# Patient Record
Sex: Male | Born: 1945 | Race: White | Hispanic: No | Marital: Married | Smoking: Never smoker
Health system: Southern US, Community
[De-identification: ages and names within clinical notes are randomized; demographics above are authoritative.]

## PROBLEM LIST (undated history)

## (undated) DIAGNOSIS — T783XXA Angioneurotic edema, initial encounter: Secondary | ICD-10-CM

## (undated) DIAGNOSIS — L309 Dermatitis, unspecified: Secondary | ICD-10-CM

## (undated) HISTORY — DX: Dermatitis, unspecified: L30.9

## (undated) HISTORY — DX: Angioneurotic edema, initial encounter: T78.3XXA

---

## 2000-07-29 ENCOUNTER — Encounter: Admission: RE | Admit: 2000-07-29 | Discharge: 2000-07-29 | Payer: Self-pay | Admitting: Family Medicine

## 2000-09-09 ENCOUNTER — Encounter: Admission: RE | Admit: 2000-09-09 | Discharge: 2000-09-09 | Payer: Self-pay | Admitting: Family Medicine

## 2005-01-21 HISTORY — PX: PROSTATECTOMY: SHX69

## 2005-02-12 ENCOUNTER — Ambulatory Visit: Admission: RE | Admit: 2005-02-12 | Discharge: 2005-02-25 | Payer: Self-pay | Admitting: Radiation Oncology

## 2005-03-27 ENCOUNTER — Encounter (INDEPENDENT_AMBULATORY_CARE_PROVIDER_SITE_OTHER): Payer: Self-pay | Admitting: Specialist

## 2005-03-27 ENCOUNTER — Inpatient Hospital Stay (HOSPITAL_COMMUNITY): Admission: RE | Admit: 2005-03-27 | Discharge: 2005-03-28 | Payer: Self-pay | Admitting: Urology

## 2009-04-13 ENCOUNTER — Ambulatory Visit (HOSPITAL_BASED_OUTPATIENT_CLINIC_OR_DEPARTMENT_OTHER): Admission: RE | Admit: 2009-04-13 | Discharge: 2009-04-13 | Payer: Self-pay | Admitting: Otolaryngology

## 2009-04-18 ENCOUNTER — Ambulatory Visit: Payer: Self-pay | Admitting: Internal Medicine

## 2010-06-08 NOTE — Op Note (Signed)
Chase Guzman, Chase Guzman                 ACCOUNT NO.:  [id number]   MEDICAL RECORD NO.:  [id number]          PATIENT TYPE:  INP   LOCATION:  1407                         FACILITY:  [facility]   PHYSICIAN:  Valetta Fuller, M.D.  DATE OF BIRTH:  Dec 16, 1945   DATE OF PROCEDURE:  03/27/2005  DATE OF DISCHARGE:  03/28/2005                                 OPERATIVE REPORT   PREOPERATIVE DIAGNOSIS:  Clinical stage T1C adenocarcinoma of the prostate.   POSTOPERATIVE DIAGNOSIS:  Clinical stage T1C adenocarcinoma of the prostate.   PROCEDURE PERFORMED:  Robotic assisted laparoscopic radical prostatectomy  with bilateral nerve-sparing.   SURGEON:  Dr. Isabel Caprice   ASSISTANT:  Dr. Laverle Patter   ANESTHESIA:  General.   COMPLICATIONS:  None.   BLOOD LOSS:  200-300 mL.   INDICATIONS:  Mr. Buenger is a 65 year old male.  He was sent to me through  the courtesy of Dr. Su Grand.  The patient was diagnosed with clinical  stage T1C adenocarcinoma of the prostate.  PSA was just borderline at 3.8.  The patient appeared to have low-volume microscopic Gleason 3 + 3 = 6  cancer.  The patient obviously is at low risk for metastatic or locally  advanced disease.  He is generally a young and healthy man.  He has  undergone extensive counseling by Dr. Brunilda Payor, Dr. Ballard Russell, and myself with  regard to treatment options.  He understands that potentially he could even  be observed with active surveillance.  The patient did request definitive  therapy.  He has had multiple discussions about the pros and cons of a  radiation versus surgical approach and elected to proceed with laparoscopic  prostatectomy.  He appeared to understand the advantages and disadvantages,  the potential complications that can occur.  We did not feel that he needed  lymph node dissection given his numbers but was a candidate for bilateral  nerve spare.  He presents now for the procedure.   TECHNIQUE AND FINDINGS:  The patient was brought to the  operating room.  He  had had a full bowel prep and received perioperative antibiotics.  The  patient had successful induction of general anesthesia and was then placed  in dorsal lithotomy position.  He was carefully padded and secured to the  table.  He was then prepped and draped in the usual manner.  A site was  chosen 18 cm from the pubic symphysis just lateral and left to the umbilicus  for placement of the camera port.  This was done using a standard Hasson  open technique.  Once the fascia was opened, finger dissection was used, and  there were no adhesions.  A 12-mm port was placed, and the abdomen was  insufflated.  A Foley catheter had previously been placed, and the bladder  had been emptied.  Careful inspection of the abdomen did not reveal any  abnormalities.  There was a little adhesion in the midline which was taken  down after placement of all the ports.  The patient had 8-mm robotic ports  placed lateral to the camera port  and also an additional port for the third  arm assessed.  A 12 mm laparoscopic assist port was placed, and a 5-mm port  was also placed for suction.  All these placements were done with direct  visual guidance.  Once all the ports were placed, a surgical cart was  docked.   Hook cautery was utilized to reflect the bladder posteriorly and allowing  entry of the space of Retzius.  Prostatic and __________ fascia was  identified and then incised from the apex down to the base of the prostate.  Underlying levator musculature was swept away, isolating the dorsal vein  complex.  Prostatic ligaments were taken down with electrocautery.  The  dorsal vein complex was isolated and divided using an ETS stapling device  down to the urethra.  With the aid of the catheter and the Foley balloon,  the bladder neck was identified and divided anteriorly with cautery.  The  Foley balloon was then deflated, and the catheter was used to retract the  prostate anteriorly.   The bladder neck was then dissected posteriorly,  identifying the ureteral orifices.  Posterior to the bladder neck, the vas  deferens and seminal vesicles were identified.  These structures were  individually isolated and then retracted anteriorly.  We were then able to  easily establish a posterior plane between the rectum and prostate,  utilizing primarily blunt dissective technique.   Attention was then turned to the endopelvic fascia along the lateral aspect  of the prostate which was taken down in order to provide a nerve sparing  bilaterally.  Once this was done, we were able to identify the pedicles  which were isolated and taken down with hemoclips.  We were able to continue  to monitor the area of the neurovascular bundle which was capped lateral and  posterior.  The urethra was then sharply divided which allowed the prostatic  specimen to be placed up in the abdomen.  The pelvis was copiously irrigated  and with a rectal Foley, injected.  There was no evidence of any rectal  injury.  Hemostasis remained quite good.   Attention was then turned to the reconstruction.  We went ahead and closed  the bladder neck with a couple of figure 8 Vicryl sutures at the 3 and 9  o'clock position.  Again, orifices were identified.  The posterior bladder  neck and posterior urethra were then reapproximated with an interrupted 2-0  Vicryl suture.  The anastomosis was then done in a running manner with a 2-0  Monocryl double-armed anastomotic suture, performing a 360 degree running  anastomosis between the bladder neck and urethra.  A new Foley catheter was  replaced in the bladder without difficulty, and irrigation showed no  leakage.  A 19-French Blake drain was then passed through the left robotic  port and positioned in the pelvis and secured.  The surgical cart was then  undocked, and the Endopouch was used to retrieve the specimen through the camera port which had to be opened slightly.  A  3-0 Vicryl suture was used  to close the 12 mm assist port site.  This was done with the aid of a suture  passer.  The camera port incision was then closed with a running 0 Vicryl  suture.  Other port sites were closed with staples.  The patient appeared to  tolerate the procedure well and was brought to recovery room in stable  condition.  ______________________________  Valetta Fuller, M.D.  Electronically Signed     DSG/MEDQ  D:  03/29/2005  T:  03/30/2005  Job:  [id number]

## 2010-06-08 NOTE — H&P (Signed)
NAMESILVER, Chase Guzman                 ACCOUNT NO.:  [id number]   MEDICAL RECORD NO.:  [id number]          PATIENT TYPE:  INP   LOCATION:  0005                         FACILITY:  [facility]   PHYSICIAN:  Valetta Fuller, M.D.  DATE OF BIRTH:  May 08, 1945   DATE OF ADMISSION:  03/27/2005  DATE OF DISCHARGE:                                HISTORY & PHYSICAL   ADMITTING DIAGNOSIS:  Clinical stage TIc adenocarcinoma of the prostate.   HISTORY OF PRESENT ILLNESS:  Mr. Gitlin is a very pleasant and generally  healthy 65 year old male. He was recently sent to me through the courtesy of  Dr. Georgina Pillion for further discussion of ongoing management of his recently-  diagnosed clinical stage TIc prostate cancer. He was specifically sent for  discussion of robotic-assisted laparoscopic prostatectomy. The patient was  noted to have a PSA a year ago of about 3.2. His PSA increased to 3.8 and  for that reason he had an ultrasound and biopsy. Initial biopsy showed  glandular atypia and he had a repeat biopsy in December 2006 which showed a  microscopic foci of Gleason 3 + 3 = 6 tumor on the right side. Left-sided  biopsies were negative. His PSA has been less than 4. The patient  understands that he has very low-volume early-stage prostate cancer and it  is extremely unlikely for him to have any significant clinical problems for  at least 10 years given this scenario. On the other hand, he obviously has a  long life expectancy given his young age and good health and the patient did  request definitive aggressive management. We had extensive discussion about  the treatment options and he elected to undergo robot-assisted laparoscopic  prostatectomy. We feel he is a candidate for a bilateral nerve sparing. We  feel that he does not require a lymph node dissection given the situation at  hand. He is completely asymptomatic and has no other issues.   PAST HISTORY:  Really is unremarkable. The patient has no medical  problems.  He does not take any regular medications. He does have allergies to CODEINE  and PENICILLIN. The patient has had a vasectomy in the past. The patient is  a nonsmoker. There is no family history of prostate cancer.   REVIEW OF SYSTEMS:  Notable only for some mild erectile dysfunction.   PHYSICAL EXAMINATION:  GENERAL:  The patient is a well-developed, well-  nourished male in no acute distress.  VITAL SIGNS:  He is afebrile with a blood pressure 120/74 and a pulse of 68.  Current weight is approximately 224 pounds.  CHEST:  Clear to auscultation.  ABDOMEN:  Protuberant but soft without hepatosplenomegaly, tenderness,  hernia or any other palpable masses.  EXTERNAL GENITALIA:  Exam reveals a normal meatus, normal penis, normal  scrotum, testes and adnexal structures. Prostate is 1+ in size without any  worrisome features.   DATA:  Hemoglobin is 15.3. Creatinine is 1.2.   ASSESSMENT:  Clinical stage TIc adenocarcinoma prostate. The patient is  otherwise a healthy 65 year old. He is to undergo an attempt at robot-  assisted  laparoscopic radical retropubic prostatectomy and hopefully he will  be admitted for routine postoperative care.           ______________________________  Valetta Fuller, M.D.  Electronically Signed     DSG/MEDQ  D:  03/27/2005  T:  03/27/2005  Job:  [id number]

## 2013-04-14 DIAGNOSIS — J342 Deviated nasal septum: Secondary | ICD-10-CM | POA: Insufficient documentation

## 2013-04-14 DIAGNOSIS — R490 Dysphonia: Secondary | ICD-10-CM | POA: Insufficient documentation

## 2013-04-14 DIAGNOSIS — J309 Allergic rhinitis, unspecified: Secondary | ICD-10-CM | POA: Insufficient documentation

## 2013-09-28 DIAGNOSIS — J386 Stenosis of larynx: Secondary | ICD-10-CM | POA: Insufficient documentation

## 2013-10-28 DIAGNOSIS — Z9989 Dependence on other enabling machines and devices: Secondary | ICD-10-CM

## 2013-10-28 DIAGNOSIS — G4733 Obstructive sleep apnea (adult) (pediatric): Secondary | ICD-10-CM | POA: Insufficient documentation

## 2015-06-28 DIAGNOSIS — Z0000 Encounter for general adult medical examination without abnormal findings: Secondary | ICD-10-CM | POA: Diagnosis not present

## 2015-06-28 DIAGNOSIS — Z1159 Encounter for screening for other viral diseases: Secondary | ICD-10-CM | POA: Diagnosis not present

## 2015-06-28 DIAGNOSIS — D239 Other benign neoplasm of skin, unspecified: Secondary | ICD-10-CM | POA: Diagnosis not present

## 2015-06-28 DIAGNOSIS — L570 Actinic keratosis: Secondary | ICD-10-CM | POA: Diagnosis not present

## 2015-07-18 DIAGNOSIS — J386 Stenosis of larynx: Secondary | ICD-10-CM | POA: Diagnosis not present

## 2015-07-18 DIAGNOSIS — J384 Edema of larynx: Secondary | ICD-10-CM | POA: Diagnosis not present

## 2015-07-18 DIAGNOSIS — R490 Dysphonia: Secondary | ICD-10-CM | POA: Diagnosis not present

## 2015-07-19 DIAGNOSIS — R490 Dysphonia: Secondary | ICD-10-CM | POA: Diagnosis not present

## 2015-07-31 DIAGNOSIS — Z8546 Personal history of malignant neoplasm of prostate: Secondary | ICD-10-CM | POA: Diagnosis not present

## 2016-01-17 DIAGNOSIS — Z880 Allergy status to penicillin: Secondary | ICD-10-CM | POA: Diagnosis not present

## 2016-01-17 DIAGNOSIS — R768 Other specified abnormal immunological findings in serum: Secondary | ICD-10-CM | POA: Diagnosis not present

## 2016-01-17 DIAGNOSIS — J386 Stenosis of larynx: Secondary | ICD-10-CM | POA: Diagnosis not present

## 2016-01-21 DIAGNOSIS — J069 Acute upper respiratory infection, unspecified: Secondary | ICD-10-CM | POA: Diagnosis not present

## 2016-01-25 DIAGNOSIS — E039 Hypothyroidism, unspecified: Secondary | ICD-10-CM | POA: Diagnosis not present

## 2016-01-25 DIAGNOSIS — G4733 Obstructive sleep apnea (adult) (pediatric): Secondary | ICD-10-CM | POA: Diagnosis not present

## 2016-01-25 DIAGNOSIS — E559 Vitamin D deficiency, unspecified: Secondary | ICD-10-CM | POA: Diagnosis not present

## 2016-02-14 DIAGNOSIS — Z713 Dietary counseling and surveillance: Secondary | ICD-10-CM | POA: Diagnosis not present

## 2016-03-20 DIAGNOSIS — G4733 Obstructive sleep apnea (adult) (pediatric): Secondary | ICD-10-CM | POA: Diagnosis not present

## 2016-05-15 DIAGNOSIS — Z0000 Encounter for general adult medical examination without abnormal findings: Secondary | ICD-10-CM | POA: Diagnosis not present

## 2016-05-15 DIAGNOSIS — Z1159 Encounter for screening for other viral diseases: Secondary | ICD-10-CM | POA: Diagnosis not present

## 2016-05-15 DIAGNOSIS — Z8546 Personal history of malignant neoplasm of prostate: Secondary | ICD-10-CM | POA: Diagnosis not present

## 2016-05-15 DIAGNOSIS — Z13220 Encounter for screening for lipoid disorders: Secondary | ICD-10-CM | POA: Diagnosis not present

## 2016-05-15 DIAGNOSIS — E559 Vitamin D deficiency, unspecified: Secondary | ICD-10-CM | POA: Diagnosis not present

## 2016-05-15 DIAGNOSIS — E039 Hypothyroidism, unspecified: Secondary | ICD-10-CM | POA: Diagnosis not present

## 2016-09-04 DIAGNOSIS — E039 Hypothyroidism, unspecified: Secondary | ICD-10-CM | POA: Diagnosis not present

## 2017-04-09 DIAGNOSIS — R0981 Nasal congestion: Secondary | ICD-10-CM | POA: Diagnosis not present

## 2017-07-01 DIAGNOSIS — E039 Hypothyroidism, unspecified: Secondary | ICD-10-CM | POA: Diagnosis not present

## 2017-07-01 DIAGNOSIS — Z8546 Personal history of malignant neoplasm of prostate: Secondary | ICD-10-CM | POA: Diagnosis not present

## 2017-07-01 DIAGNOSIS — R6889 Other general symptoms and signs: Secondary | ICD-10-CM | POA: Diagnosis not present

## 2017-07-01 DIAGNOSIS — Z0000 Encounter for general adult medical examination without abnormal findings: Secondary | ICD-10-CM | POA: Diagnosis not present

## 2017-07-01 DIAGNOSIS — Z13220 Encounter for screening for lipoid disorders: Secondary | ICD-10-CM | POA: Diagnosis not present

## 2017-07-01 DIAGNOSIS — E559 Vitamin D deficiency, unspecified: Secondary | ICD-10-CM | POA: Diagnosis not present

## 2017-07-14 ENCOUNTER — Ambulatory Visit: Payer: Self-pay | Admitting: Allergy and Immunology

## 2017-08-08 ENCOUNTER — Ambulatory Visit (INDEPENDENT_AMBULATORY_CARE_PROVIDER_SITE_OTHER): Payer: Self-pay | Admitting: Allergy

## 2017-08-08 ENCOUNTER — Encounter: Payer: Self-pay | Admitting: Allergy

## 2017-08-08 VITALS — BP 128/80 | HR 63 | Temp 98.1°F | Resp 20 | Ht 67.25 in | Wt 240.8 lb

## 2017-08-08 DIAGNOSIS — Z889 Allergy status to unspecified drugs, medicaments and biological substances status: Secondary | ICD-10-CM

## 2017-08-08 DIAGNOSIS — J310 Chronic rhinitis: Secondary | ICD-10-CM

## 2017-08-08 MED ORDER — FLUTICASONE PROPIONATE 93 MCG/ACT NA EXHU: 2.0000 | INHALANT_SUSPENSION | Freq: Two times a day (BID) | NASAL | 5 refills | Status: DC

## 2017-08-08 NOTE — Patient Instructions (Addendum)
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   - for nasal congestion/drainage recommend use of XHance device 2 sprays each nostril twice a day.  Demonstration shown today  - recommend nasal saline rinse and best to use prior to use of medicated nasal sprays  - recommend taking a long-acting antihistamine like Allegra 180mg , Xyzal 5mg  or Zyrtec 5mg  daily as needed  Drug allergy  - if interested in clearing penicillin allergy would recommend in-office graded challenge to penicillin in future.    Follow-up 6-9 months or sooner if needed

## 2017-08-08 NOTE — Progress Notes (Signed)
New Patient Note  RE: Chase Guzman MRN: [id number] DOB: Jun 04, 1945 Date of Office Visit: 08/08/2017  Referring provider: [name], MD Primary care provider: [name], MD  Chief Complaint: congestion  History of present illness: Chase Guzman is a 72 y.o. male presenting today for consultation for nasal and throat congestion.   He states he has been having issues with congestion that can come on after eating bread, drinking beer and some other food products.   He reports it can also happen without any stimuli.  He always has some degree of nasal congestion and drainage.  He wakes up with congestion.   He does report itchy, watery eyes during pollen season.  He has never tried taking any antihistamines.  He has used flonase in the past for nasal congestion.  He does not feel it helped his congestion.  He states he will occasionally use Afrin which does work very well and states at one point in time he was using too much and had to wean off.     He has never had any allergy testing performed before.    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  He also states he has a unrepaired deviated septum.  He states he is still in the process of diagnosing why he has subglottic stenosis.  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 He states he does have some SOB due to the degree of stenosis but has never required use of inhalers or steroid medications.    He has history of OSA.    He states he does have some mild eczema and only uses moisturization to treat/control.    He states he developed an itchy rash with amoxicillin many years ago (>10 years).    Review of systems: Review of Systems  Constitutional: Negative for chills, fever and malaise/fatigue.  HENT: Positive for congestion. Negative for ear discharge, ear pain, nosebleeds, sinus pain, sore throat and tinnitus.   Eyes: Negative for pain, discharge and redness.    Respiratory: Positive for shortness of breath. Negative for cough, sputum production and wheezing.   Cardiovascular: Negative for chest pain.  Gastrointestinal: Negative for abdominal pain, constipation, diarrhea, heartburn, nausea and vomiting.  Musculoskeletal: Negative for joint pain.  Skin: Negative for itching and rash.  Neurological: Negative for headaches.    All other systems negative unless noted above in HPI  Past medical history: Past Medical History:  Diagnosis Date  . Angio-edema   . Eczema     Past surgical history: Past Surgical History:  Procedure Laterality Date  . PROSTATECTOMY N/A 2007    Family history:  Family History  Problem Relation Age of Onset  . Asthma Father     Social history: Lives in a home with carpeting with electric heating and central cooling.  No pets in the home.  He is a Recruitment consultant at [employer].  Denies smoking history.    Medication List: Allergies as of 08/08/2017      Reactions   Amoxicillin Rash   Penicillins Rash      Medication List        Accurate as of 08/08/17 12:50 PM. Always use your most recent med list.          calcium-vitamin D 250-100 MG-UNIT tablet Take 1 tablet by mouth 2 (two) times daily.   levothyroxine 75 MCG tablet Commonly known as:  SYNTHROID, LEVOTHROID Take 75  mcg by mouth daily before breakfast.       Known medication allergies: Allergies  Allergen Reactions  . Amoxicillin Rash  . Penicillins Rash     Physical examination: Blood pressure 128/80, pulse 63, temperature 98.1 F (36.7 C), temperature source Oral, resp. rate 20, height 5' 7.25" (1.708 m), weight 240 lb 12.8 oz (109.2 kg), SpO2 96 %.  General: Alert, interactive, in no acute distress. HEENT: PERRLA, TMs pearly gray, turbinates moderately edematous L>R with thick discharge, post-pharynx non erythematous. Neck: Supple without lymphadenopathy. Lungs: Clear to auscultation without wheezing, rhonchi or rales. {no  increased work of breathing. CV: Normal S1, S2 without murmurs. Abdomen: Nondistended, nontender. Skin: Warm and dry, without lesions or rashes. Extremities:  No clubbing, cyanosis or edema. Neuro:   Grossly intact.  Diagnositics/Labs: Allergy testing: environmental allergy skin prick testing is positive to [place] plantain, [place]ickory, oak, pecan Intradermal testing is positive to ragweed mix, mold mix 1, mold mix 3, cat, dog, dust mite mix Select food allergy skin prick testing is negative Allergy testing results were read and interpreted by provider, documented by clinical staff.   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)  - environmental allergy skin testing is positive to tree pollens, weed pollen including ragweed, molds, cat, dog and dust mites Allergen avoidance measures discusses/handouts provided  - select food allergy skin prick testing is negative to wheat, oat, rye, barley, hops, chicken,soybean, milk, egg, almond, coconut, rice, yeast, [place], beef, white potato, onion, corn, grape, cinnamon  - there is likely a component of gustatory rhinitis which is a non-allergic form of rhinitis that can lead to increased nasal congestion and drainage after eating certain types of foods (especially hot or spicy foods).   - for nasal congestion/drainage recommend use of XHance device 2 sprays each nostril twice a day.  Demonstration shown today  - recommend nasal saline rinse and best to use prior to use of medicated nasal sprays  - recommend taking a long-acting antihistamine like Allegra 180mg , Xyzal 5mg  or Zyrtec 5mg  daily as needed  Drug allergy  - if interested in clearing penicillin allergy would recommend in-office graded challenge to penicillin in future.    Follow-up 6-9 months or sooner if needed  I appreciate the opportunity to take part in Chase Guzman's care. Please do not hesitate to contact me with questions.  Sincerely,   Prudy Feeler, MD Allergy/Immunology [facility]

## 2017-08-11 DIAGNOSIS — J384 Edema of larynx: Secondary | ICD-10-CM | POA: Diagnosis not present

## 2017-08-11 DIAGNOSIS — R490 Dysphonia: Secondary | ICD-10-CM | POA: Diagnosis not present

## 2017-08-11 DIAGNOSIS — J386 Stenosis of larynx: Secondary | ICD-10-CM | POA: Diagnosis not present

## 2017-09-21 DIAGNOSIS — B349 Viral infection, unspecified: Secondary | ICD-10-CM | POA: Diagnosis not present

## 2017-09-21 DIAGNOSIS — J209 Acute bronchitis, unspecified: Secondary | ICD-10-CM | POA: Diagnosis not present

## 2017-11-26 DIAGNOSIS — Z23 Encounter for immunization: Secondary | ICD-10-CM | POA: Diagnosis not present

## 2018-05-04 ENCOUNTER — Ambulatory Visit: Payer: Self-pay | Admitting: Allergy

## 2018-05-07 ENCOUNTER — Ambulatory Visit: Payer: Self-pay | Admitting: Allergy

## 2018-05-19 ENCOUNTER — Telehealth: Payer: Self-pay

## 2018-05-19 NOTE — Telephone Encounter (Signed)
Contacted patient and stated he was dissatisfied with OV and stated his insurance is [employer] and with [employer]. He would like to be removed from our office.

## 2018-07-16 DIAGNOSIS — Z8546 Personal history of malignant neoplasm of prostate: Secondary | ICD-10-CM | POA: Diagnosis not present

## 2018-07-16 DIAGNOSIS — E7800 Pure hypercholesterolemia, unspecified: Secondary | ICD-10-CM | POA: Diagnosis not present

## 2018-07-16 DIAGNOSIS — E559 Vitamin D deficiency, unspecified: Secondary | ICD-10-CM | POA: Diagnosis not present

## 2018-07-16 DIAGNOSIS — E039 Hypothyroidism, unspecified: Secondary | ICD-10-CM | POA: Diagnosis not present

## 2018-08-07 DIAGNOSIS — Z8249 Family history of ischemic heart disease and other diseases of the circulatory system: Secondary | ICD-10-CM | POA: Diagnosis not present

## 2018-08-07 DIAGNOSIS — E039 Hypothyroidism, unspecified: Secondary | ICD-10-CM | POA: Diagnosis not present

## 2018-08-07 DIAGNOSIS — E7800 Pure hypercholesterolemia, unspecified: Secondary | ICD-10-CM | POA: Diagnosis not present

## 2018-08-07 DIAGNOSIS — Z8546 Personal history of malignant neoplasm of prostate: Secondary | ICD-10-CM | POA: Diagnosis not present

## 2018-08-07 DIAGNOSIS — E559 Vitamin D deficiency, unspecified: Secondary | ICD-10-CM | POA: Diagnosis not present

## 2018-09-23 DIAGNOSIS — G4733 Obstructive sleep apnea (adult) (pediatric): Secondary | ICD-10-CM | POA: Diagnosis not present

## 2018-10-09 DIAGNOSIS — G4733 Obstructive sleep apnea (adult) (pediatric): Secondary | ICD-10-CM | POA: Diagnosis not present

## 2018-10-19 DIAGNOSIS — Z23 Encounter for immunization: Secondary | ICD-10-CM | POA: Diagnosis not present

## 2018-11-08 DIAGNOSIS — G4733 Obstructive sleep apnea (adult) (pediatric): Secondary | ICD-10-CM | POA: Diagnosis not present

## 2018-12-09 DIAGNOSIS — G4733 Obstructive sleep apnea (adult) (pediatric): Secondary | ICD-10-CM | POA: Diagnosis not present

## 2019-01-04 DIAGNOSIS — G4733 Obstructive sleep apnea (adult) (pediatric): Secondary | ICD-10-CM | POA: Diagnosis not present

## 2019-01-08 DIAGNOSIS — G4733 Obstructive sleep apnea (adult) (pediatric): Secondary | ICD-10-CM | POA: Diagnosis not present

## 2019-03-18 DIAGNOSIS — G4733 Obstructive sleep apnea (adult) (pediatric): Secondary | ICD-10-CM | POA: Diagnosis not present

## 2019-03-19 ENCOUNTER — Ambulatory Visit: Payer: Self-pay

## 2019-04-01 DIAGNOSIS — R609 Edema, unspecified: Secondary | ICD-10-CM | POA: Diagnosis not present

## 2019-04-01 DIAGNOSIS — L309 Dermatitis, unspecified: Secondary | ICD-10-CM | POA: Diagnosis not present

## 2019-04-01 DIAGNOSIS — B029 Zoster without complications: Secondary | ICD-10-CM | POA: Diagnosis not present

## 2019-04-19 DIAGNOSIS — E7800 Pure hypercholesterolemia, unspecified: Secondary | ICD-10-CM | POA: Diagnosis not present

## 2019-04-19 DIAGNOSIS — Z8546 Personal history of malignant neoplasm of prostate: Secondary | ICD-10-CM | POA: Diagnosis not present

## 2019-04-19 DIAGNOSIS — E559 Vitamin D deficiency, unspecified: Secondary | ICD-10-CM | POA: Diagnosis not present

## 2019-04-19 DIAGNOSIS — L309 Dermatitis, unspecified: Secondary | ICD-10-CM | POA: Diagnosis not present

## 2019-04-19 DIAGNOSIS — E039 Hypothyroidism, unspecified: Secondary | ICD-10-CM | POA: Diagnosis not present

## 2019-04-19 DIAGNOSIS — D229 Melanocytic nevi, unspecified: Secondary | ICD-10-CM | POA: Diagnosis not present

## 2019-04-19 DIAGNOSIS — Z0000 Encounter for general adult medical examination without abnormal findings: Secondary | ICD-10-CM | POA: Diagnosis not present

## 2019-06-13 ENCOUNTER — Emergency Department (HOSPITAL_COMMUNITY): Payer: BLUE CROSS/BLUE SHIELD

## 2019-06-13 ENCOUNTER — Inpatient Hospital Stay (HOSPITAL_COMMUNITY)
Admission: EM | Admit: 2019-06-13 | Discharge: 2019-06-22 | DRG: 207 | Disposition: A | Discharge status: Home / Self Care | Payer: BLUE CROSS/BLUE SHIELD | Attending: Internal Medicine | Admitting: Internal Medicine

## 2019-06-13 ENCOUNTER — Encounter (HOSPITAL_COMMUNITY): Payer: Self-pay | Admitting: Emergency Medicine

## 2019-06-13 ENCOUNTER — Other Ambulatory Visit: Payer: Self-pay

## 2019-06-13 ENCOUNTER — Inpatient Hospital Stay (HOSPITAL_COMMUNITY): Payer: BLUE CROSS/BLUE SHIELD

## 2019-06-13 DIAGNOSIS — J9383 Other pneumothorax: Secondary | ICD-10-CM

## 2019-06-13 DIAGNOSIS — J386 Stenosis of larynx: Secondary | ICD-10-CM | POA: Diagnosis not present

## 2019-06-13 DIAGNOSIS — E872 Acidosis: Secondary | ICD-10-CM | POA: Diagnosis present

## 2019-06-13 DIAGNOSIS — J9601 Acute respiratory failure with hypoxia: Secondary | ICD-10-CM | POA: Diagnosis present

## 2019-06-13 DIAGNOSIS — R339 Retention of urine, unspecified: Secondary | ICD-10-CM | POA: Diagnosis not present

## 2019-06-13 DIAGNOSIS — E039 Hypothyroidism, unspecified: Secondary | ICD-10-CM | POA: Diagnosis not present

## 2019-06-13 DIAGNOSIS — T17998A Other foreign object in respiratory tract, part unspecified causing other injury, initial encounter: Secondary | ICD-10-CM | POA: Diagnosis not present

## 2019-06-13 DIAGNOSIS — J939 Pneumothorax, unspecified: Secondary | ICD-10-CM | POA: Diagnosis not present

## 2019-06-13 DIAGNOSIS — J342 Deviated nasal septum: Secondary | ICD-10-CM | POA: Diagnosis present

## 2019-06-13 DIAGNOSIS — R6521 Severe sepsis with septic shock: Secondary | ICD-10-CM

## 2019-06-13 DIAGNOSIS — N5089 Other specified disorders of the male genital organs: Secondary | ICD-10-CM | POA: Diagnosis present

## 2019-06-13 DIAGNOSIS — X58XXXA Exposure to other specified factors, initial encounter: Secondary | ICD-10-CM | POA: Diagnosis not present

## 2019-06-13 DIAGNOSIS — J690 Pneumonitis due to inhalation of food and vomit: Secondary | ICD-10-CM | POA: Diagnosis not present

## 2019-06-13 DIAGNOSIS — A419 Sepsis, unspecified organism: Secondary | ICD-10-CM

## 2019-06-13 DIAGNOSIS — I462 Cardiac arrest due to underlying cardiac condition: Secondary | ICD-10-CM | POA: Diagnosis not present

## 2019-06-13 DIAGNOSIS — R34 Anuria and oliguria: Secondary | ICD-10-CM | POA: Diagnosis not present

## 2019-06-13 DIAGNOSIS — I469 Cardiac arrest, cause unspecified: Secondary | ICD-10-CM | POA: Diagnosis not present

## 2019-06-13 DIAGNOSIS — Z880 Allergy status to penicillin: Secondary | ICD-10-CM | POA: Diagnosis not present

## 2019-06-13 DIAGNOSIS — Z01818 Encounter for other preprocedural examination: Secondary | ICD-10-CM

## 2019-06-13 DIAGNOSIS — D649 Anemia, unspecified: Secondary | ICD-10-CM | POA: Diagnosis not present

## 2019-06-13 DIAGNOSIS — Z9119 Patient's noncompliance with other medical treatment and regimen: Secondary | ICD-10-CM | POA: Diagnosis not present

## 2019-06-13 DIAGNOSIS — R092 Respiratory arrest: Secondary | ICD-10-CM

## 2019-06-13 DIAGNOSIS — Z452 Encounter for adjustment and management of vascular access device: Secondary | ICD-10-CM | POA: Diagnosis not present

## 2019-06-13 DIAGNOSIS — J9600 Acute respiratory failure, unspecified whether with hypoxia or hypercapnia: Secondary | ICD-10-CM

## 2019-06-13 DIAGNOSIS — N281 Cyst of kidney, acquired: Secondary | ICD-10-CM | POA: Diagnosis not present

## 2019-06-13 DIAGNOSIS — J80 Acute respiratory distress syndrome: Secondary | ICD-10-CM | POA: Diagnosis not present

## 2019-06-13 DIAGNOSIS — R9431 Abnormal electrocardiogram [ECG] [EKG]: Secondary | ICD-10-CM | POA: Diagnosis not present

## 2019-06-13 DIAGNOSIS — J439 Emphysema, unspecified: Secondary | ICD-10-CM | POA: Diagnosis present

## 2019-06-13 DIAGNOSIS — R404 Transient alteration of awareness: Secondary | ICD-10-CM | POA: Diagnosis not present

## 2019-06-13 DIAGNOSIS — Z20822 Contact with and (suspected) exposure to covid-19: Secondary | ICD-10-CM | POA: Diagnosis not present

## 2019-06-13 DIAGNOSIS — J9811 Atelectasis: Secondary | ICD-10-CM | POA: Diagnosis not present

## 2019-06-13 DIAGNOSIS — Z9079 Acquired absence of other genital organ(s): Secondary | ICD-10-CM

## 2019-06-13 DIAGNOSIS — Z9911 Dependence on respirator [ventilator] status: Secondary | ICD-10-CM | POA: Diagnosis not present

## 2019-06-13 DIAGNOSIS — R918 Other nonspecific abnormal finding of lung field: Secondary | ICD-10-CM | POA: Diagnosis not present

## 2019-06-13 DIAGNOSIS — Z881 Allergy status to other antibiotic agents status: Secondary | ICD-10-CM

## 2019-06-13 DIAGNOSIS — Z4659 Encounter for fitting and adjustment of other gastrointestinal appliance and device: Secondary | ICD-10-CM

## 2019-06-13 DIAGNOSIS — J189 Pneumonia, unspecified organism: Secondary | ICD-10-CM

## 2019-06-13 DIAGNOSIS — Y92239 Unspecified place in hospital as the place of occurrence of the external cause: Secondary | ICD-10-CM | POA: Diagnosis not present

## 2019-06-13 DIAGNOSIS — R739 Hyperglycemia, unspecified: Secondary | ICD-10-CM | POA: Diagnosis not present

## 2019-06-13 DIAGNOSIS — I4510 Unspecified right bundle-branch block: Secondary | ICD-10-CM | POA: Diagnosis not present

## 2019-06-13 DIAGNOSIS — R000 Tachycardia, unspecified: Secondary | ICD-10-CM | POA: Diagnosis not present

## 2019-06-13 DIAGNOSIS — R001 Bradycardia, unspecified: Secondary | ICD-10-CM | POA: Diagnosis not present

## 2019-06-13 DIAGNOSIS — Z4682 Encounter for fitting and adjustment of non-vascular catheter: Secondary | ICD-10-CM | POA: Diagnosis not present

## 2019-06-13 DIAGNOSIS — S270XXD Traumatic pneumothorax, subsequent encounter: Secondary | ICD-10-CM | POA: Diagnosis not present

## 2019-06-13 DIAGNOSIS — Z0189 Encounter for other specified special examinations: Secondary | ICD-10-CM

## 2019-06-13 DIAGNOSIS — R0603 Acute respiratory distress: Secondary | ICD-10-CM | POA: Diagnosis not present

## 2019-06-13 DIAGNOSIS — R110 Nausea: Secondary | ICD-10-CM

## 2019-06-13 DIAGNOSIS — R0602 Shortness of breath: Secondary | ICD-10-CM | POA: Diagnosis not present

## 2019-06-13 DIAGNOSIS — G4733 Obstructive sleep apnea (adult) (pediatric): Secondary | ICD-10-CM | POA: Diagnosis not present

## 2019-06-13 DIAGNOSIS — R4020 Unspecified coma: Secondary | ICD-10-CM | POA: Diagnosis not present

## 2019-06-13 LAB — I-STAT CHEM 8, ED
BUN: 26 mg/dL — ABNORMAL HIGH (ref 8–23)
Calcium, Ion: 1.17 mmol/L (ref 1.15–1.40)
Chloride: 102 mmol/L (ref 98–111)
Creatinine, Ser: 1.4 mg/dL — ABNORMAL HIGH (ref 0.61–1.24)
Glucose, Bld: 218 mg/dL — ABNORMAL HIGH (ref 70–99)
HCT: 50 % (ref 39.0–52.0)
Hemoglobin: 17 g/dL (ref 13.0–17.0)
Potassium: 4.5 mmol/L (ref 3.5–5.1)
Sodium: 138 mmol/L (ref 135–145)
TCO2: 28 mmol/L (ref 22–32)

## 2019-06-13 LAB — POCT I-STAT 7, (LYTES, BLD GAS, ICA,H+H)
Acid-base deficit: 2 mmol/L (ref 0.0–2.0)
Acid-base deficit: 7 mmol/L — ABNORMAL HIGH (ref 0.0–2.0)
Bicarbonate: 25.2 mmol/L (ref 20.0–28.0)
Bicarbonate: 18.7 mmol/L — ABNORMAL LOW (ref 20.0–28.0)
Calcium, Ion: 1.3 mmol/L (ref 1.15–1.40)
Calcium, Ion: 1.2 mmol/L (ref 1.15–1.40)
HCT: 45 % (ref 39.0–52.0)
HCT: 43 % (ref 39.0–52.0)
Hemoglobin: 15.3 g/dL (ref 13.0–17.0)
Hemoglobin: 14.6 g/dL (ref 13.0–17.0)
O2 Saturation: 93 %
O2 Saturation: 95 %
Patient temperature: 97.1
Patient temperature: 97.9
Potassium: 3.6 mmol/L (ref 3.5–5.1)
Potassium: 4.6 mmol/L (ref 3.5–5.1)
Sodium: 138 mmol/L (ref 135–145)
Sodium: 139 mmol/L (ref 135–145)
TCO2: 27 mmol/L (ref 22–32)
TCO2: 20 mmol/L — ABNORMAL LOW (ref 22–32)
pCO2 arterial: 50.8 mmHg — ABNORMAL HIGH (ref 32.0–48.0)
pCO2 arterial: 36.2 mmHg (ref 32.0–48.0)
pH, Arterial: 7.3 — ABNORMAL LOW (ref 7.350–7.450)
pH, Arterial: 7.319 — ABNORMAL LOW (ref 7.350–7.450)
pO2, Arterial: 73 mmHg — ABNORMAL LOW (ref 83.0–108.0)
pO2, Arterial: 79 mmHg — ABNORMAL LOW (ref 83.0–108.0)

## 2019-06-13 LAB — COMPREHENSIVE METABOLIC PANEL
ALT: 24 U/L (ref 0–44)
AST: 29 U/L (ref 15–41)
Albumin: 3.6 g/dL (ref 3.5–5.0)
Alkaline Phosphatase: 79 U/L (ref 38–126)
Anion gap: 12 (ref 5–15)
BUN: 18 mg/dL (ref 8–23)
CO2: 23 mmol/L (ref 22–32)
Calcium: 9.4 mg/dL (ref 8.9–10.3)
Chloride: 102 mmol/L (ref 98–111)
Creatinine, Ser: 1.38 mg/dL — ABNORMAL HIGH (ref 0.61–1.24)
GFR calc Af Amer: 59 mL/min — ABNORMAL LOW (ref >60)
GFR calc non Af Amer: 51 mL/min — ABNORMAL LOW (ref >60)
Glucose, Bld: 262 mg/dL — ABNORMAL HIGH (ref 70–99)
Potassium: 4.5 mmol/L (ref 3.5–5.1)
Sodium: 137 mmol/L (ref 135–145)
Total Bilirubin: 1.1 mg/dL (ref 0.3–1.2)
Total Protein: 6.2 g/dL — ABNORMAL LOW (ref 6.5–8.1)

## 2019-06-13 LAB — LACTIC ACID, PLASMA
Lactic Acid, Venous: 5.6 mmol/L (ref 0.5–1.9)
Lactic Acid, Venous: 5.8 mmol/L (ref 0.5–1.9)
Lactic Acid, Venous: 3.6 mmol/L (ref 0.5–1.9)

## 2019-06-13 LAB — CBC
HCT: 56.1 % — ABNORMAL HIGH (ref 39.0–52.0)
Hemoglobin: 18.3 g/dL — ABNORMAL HIGH (ref 13.0–17.0)
MCH: 31.4 pg (ref 26.0–34.0)
MCHC: 32.6 g/dL (ref 30.0–36.0)
MCV: 96.4 fL (ref 80.0–100.0)
Platelets: 302 10*3/uL (ref 150–400)
RBC: 5.82 MIL/uL — ABNORMAL HIGH (ref 4.22–5.81)
RDW: 12.8 % (ref 11.5–15.5)
WBC: 11.5 10*3/uL — ABNORMAL HIGH (ref 4.0–10.5)
nRBC: 0 % (ref 0.0–0.2)

## 2019-06-13 LAB — GLUCOSE, CAPILLARY
Glucose-Capillary: 85 mg/dL (ref 70–99)
Glucose-Capillary: 82 mg/dL (ref 70–99)
Glucose-Capillary: 85 mg/dL (ref 70–99)
Glucose-Capillary: 97 mg/dL (ref 70–99)

## 2019-06-13 LAB — SARS CORONAVIRUS 2 BY RT PCR (HOSPITAL ORDER, PERFORMED IN ~~LOC~~ HOSPITAL LAB): SARS Coronavirus 2: NEGATIVE

## 2019-06-13 LAB — URINALYSIS, ROUTINE W REFLEX MICROSCOPIC
Bilirubin Urine: NEGATIVE
Glucose, UA: 50 mg/dL — AB
Hgb urine dipstick: NEGATIVE
Ketones, ur: NEGATIVE mg/dL
Leukocytes,Ua: NEGATIVE
Nitrite: NEGATIVE
Protein, ur: >=300 mg/dL — AB
Specific Gravity, Urine: 1.043 — ABNORMAL HIGH (ref 1.005–1.030)
pH: 6 (ref 5.0–8.0)

## 2019-06-13 LAB — TROPONIN I (HIGH SENSITIVITY)
Troponin I (High Sensitivity): 7 ng/L (ref <18)
Troponin I (High Sensitivity): 191 ng/L (ref <18)

## 2019-06-13 LAB — APTT: aPTT: 32 seconds (ref 24–36)

## 2019-06-13 LAB — PROTIME-INR
INR: 1.1 (ref 0.8–1.2)
Prothrombin Time: 13.6 seconds (ref 11.4–15.2)

## 2019-06-13 LAB — STREP PNEUMONIAE URINARY ANTIGEN: Strep Pneumo Urinary Antigen: NEGATIVE

## 2019-06-13 LAB — MRSA PCR SCREENING: MRSA by PCR: NEGATIVE

## 2019-06-13 MED ORDER — MIDAZOLAM HCL 2 MG/2ML IJ SOLN
1.0000 mg | INTRAMUSCULAR | Status: DC | PRN
  Administered 2019-06-13 – 2019-06-18 (×2): 1 mg via INTRAVENOUS
  Filled 2019-06-13 (×4): qty 2

## 2019-06-13 MED ORDER — ETOMIDATE 2 MG/ML IV SOLN
20.0000 mg | Freq: Once | INTRAVENOUS | Status: AC
  Administered 2019-06-13: 20 mg via INTRAVENOUS

## 2019-06-13 MED ORDER — DOCUSATE SODIUM 100 MG PO CAPS: 100.0000 mg | ORAL_CAPSULE | Freq: Two times a day (BID) | ORAL | Status: DC | PRN

## 2019-06-13 MED ORDER — FENTANYL BOLUS VIA INFUSION
25.0000 ug | INTRAVENOUS | Status: DC | PRN
  Administered 2019-06-13 (×3): 25 ug via INTRAVENOUS
  Filled 2019-06-13: qty 25

## 2019-06-13 MED ORDER — IOHEXOL 350 MG/ML SOLN
100.0000 mL | Freq: Once | INTRAVENOUS | Status: AC | PRN
  Administered 2019-06-13: 100 mL via INTRAVENOUS

## 2019-06-13 MED ORDER — CHLORHEXIDINE GLUCONATE CLOTH 2 % EX PADS
6.0000 | MEDICATED_PAD | Freq: Every day | CUTANEOUS | Status: DC
  Administered 2019-06-13 – 2019-06-22 (×9): 6 via TOPICAL

## 2019-06-13 MED ORDER — PROPOFOL 1000 MG/100ML IV EMUL
INTRAVENOUS | Status: AC
  Administered 2019-06-13: 10 ug/kg/min via INTRAVENOUS
  Filled 2019-06-13: qty 100

## 2019-06-13 MED ORDER — SODIUM CHLORIDE 0.9 % IV BOLUS
30.0000 mL/kg | Freq: Once | INTRAVENOUS | Status: AC
  Administered 2019-06-13: 2859 mL via INTRAVENOUS

## 2019-06-13 MED ORDER — ORAL CARE MOUTH RINSE
15.0000 mL | OROMUCOSAL | Status: DC
  Administered 2019-06-13 – 2019-06-19 (×54): 15 mL via OROMUCOSAL

## 2019-06-13 MED ORDER — POLYETHYLENE GLYCOL 3350 17 G PO PACK: 17.0000 g | PACK | Freq: Every day | ORAL | Status: DC | PRN

## 2019-06-13 MED ORDER — VANCOMYCIN HCL 2000 MG/400ML IV SOLN
2000.0000 mg | Freq: Once | INTRAVENOUS | Status: AC
  Administered 2019-06-13: 2000 mg via INTRAVENOUS
  Filled 2019-06-13: qty 400

## 2019-06-13 MED ORDER — MIDAZOLAM HCL 2 MG/2ML IJ SOLN
1.0000 mg | INTRAMUSCULAR | Status: DC | PRN
  Administered 2019-06-13 – 2019-06-18 (×7): 1 mg via INTRAVENOUS
  Filled 2019-06-13 (×5): qty 2

## 2019-06-13 MED ORDER — DOCUSATE SODIUM 50 MG/5ML PO LIQD
100.0000 mg | Freq: Two times a day (BID) | ORAL | Status: DC
  Administered 2019-06-13: 100 mg via ORAL
  Filled 2019-06-13 (×2): qty 10

## 2019-06-13 MED ORDER — DOCUSATE SODIUM 50 MG/5ML PO LIQD
100.0000 mg | Freq: Two times a day (BID) | ORAL | Status: DC
  Administered 2019-06-13 – 2019-06-16 (×5): 100 mg
  Filled 2019-06-13 (×6): qty 10

## 2019-06-13 MED ORDER — FAMOTIDINE IN NACL 20-0.9 MG/50ML-% IV SOLN
20.0000 mg | Freq: Two times a day (BID) | INTRAVENOUS | Status: DC
  Administered 2019-06-13 (×2): 20 mg via INTRAVENOUS
  Filled 2019-06-13 (×2): qty 50

## 2019-06-13 MED ORDER — FENTANYL 2500MCG IN NS 250ML (10MCG/ML) PREMIX INFUSION
0.0000 ug/h | INTRAVENOUS | Status: DC
  Administered 2019-06-13: 25 ug/h via INTRAVENOUS
  Filled 2019-06-13: qty 250

## 2019-06-13 MED ORDER — POLYETHYLENE GLYCOL 3350 17 G PO PACK
17.0000 g | PACK | Freq: Every day | ORAL | Status: DC
  Administered 2019-06-13: 17 g via ORAL
  Filled 2019-06-13: qty 1

## 2019-06-13 MED ORDER — PHENYLEPHRINE HCL-NACL 10-0.9 MG/250ML-% IV SOLN: 25.0000 ug/min | INTRAVENOUS | Status: DC

## 2019-06-13 MED ORDER — SODIUM CHLORIDE 0.9 % IV BOLUS
500.0000 mL | Freq: Once | INTRAVENOUS | Status: AC
  Administered 2019-06-13: 500 mL via INTRAVENOUS

## 2019-06-13 MED ORDER — CHLORHEXIDINE GLUCONATE 0.12% ORAL RINSE (MEDLINE KIT)
15.0000 mL | Freq: Two times a day (BID) | OROMUCOSAL | Status: DC
  Administered 2019-06-13 – 2019-06-19 (×13): 15 mL via OROMUCOSAL

## 2019-06-13 MED ORDER — METRONIDAZOLE IN NACL 5-0.79 MG/ML-% IV SOLN
500.0000 mg | Freq: Three times a day (TID) | INTRAVENOUS | Status: DC
  Administered 2019-06-13 – 2019-06-14 (×3): 500 mg via INTRAVENOUS
  Filled 2019-06-13 (×4): qty 100

## 2019-06-13 MED ORDER — SODIUM CHLORIDE 0.9 % IV SOLN
2.0000 g | Freq: Once | INTRAVENOUS | Status: AC
  Administered 2019-06-13: 2 g via INTRAVENOUS
  Filled 2019-06-13: qty 2

## 2019-06-13 MED ORDER — ENOXAPARIN SODIUM 40 MG/0.4ML ~~LOC~~ SOLN
40.0000 mg | Freq: Every day | SUBCUTANEOUS | Status: DC
  Administered 2019-06-13 – 2019-06-20 (×8): 40 mg via SUBCUTANEOUS
  Filled 2019-06-13 (×8): qty 0.4

## 2019-06-13 MED ORDER — SODIUM CHLORIDE 0.9 % IV SOLN
250.0000 mL | INTRAVENOUS | Status: DC
  Administered 2019-06-13: 250 mL via INTRAVENOUS

## 2019-06-13 MED ORDER — METRONIDAZOLE IN NACL 5-0.79 MG/ML-% IV SOLN
500.0000 mg | Freq: Once | INTRAVENOUS | Status: AC
  Administered 2019-06-13: 500 mg via INTRAVENOUS
  Filled 2019-06-13: qty 100

## 2019-06-13 MED ORDER — NOREPINEPHRINE 4 MG/250ML-% IV SOLN
0.0000 ug/min | INTRAVENOUS | Status: DC
  Administered 2019-06-13: 15 ug/min via INTRAVENOUS
  Administered 2019-06-13: 8 ug/min via INTRAVENOUS
  Administered 2019-06-13: 10 ug/min via INTRAVENOUS
  Filled 2019-06-13: qty 250

## 2019-06-13 MED ORDER — VANCOMYCIN HCL IN DEXTROSE 1-5 GM/200ML-% IV SOLN: 1000.0000 mg | Freq: Once | INTRAVENOUS | Status: DC

## 2019-06-13 MED ORDER — POLYETHYLENE GLYCOL 3350 17 G PO PACK
17.0000 g | PACK | Freq: Every day | ORAL | Status: DC
  Administered 2019-06-14 – 2019-06-16 (×3): 17 g
  Filled 2019-06-13 (×5): qty 1

## 2019-06-13 MED ORDER — NOREPINEPHRINE 4 MG/250ML-% IV SOLN
2.0000 ug/min | INTRAVENOUS | Status: DC
  Administered 2019-06-14: 6 ug/min via INTRAVENOUS
  Filled 2019-06-13: qty 250

## 2019-06-13 MED ORDER — ROCURONIUM BROMIDE 50 MG/5ML IV SOLN
100.0000 mg | Freq: Once | INTRAVENOUS | Status: AC
  Administered 2019-06-13: 100 mg via INTRAVENOUS

## 2019-06-13 MED ORDER — SODIUM CHLORIDE 0.9 % IV SOLN
1.0000 g | INTRAVENOUS | Status: AC
  Administered 2019-06-13 – 2019-06-19 (×7): 1 g via INTRAVENOUS
  Filled 2019-06-13: qty 1
  Filled 2019-06-13 (×2): qty 10
  Filled 2019-06-13: qty 1
  Filled 2019-06-13: qty 10
  Filled 2019-06-13: qty 1
  Filled 2019-06-13: qty 10

## 2019-06-13 MED ORDER — FENTANYL CITRATE (PF) 100 MCG/2ML IJ SOLN
25.0000 ug | Freq: Once | INTRAMUSCULAR | Status: AC
  Administered 2019-06-13: 25 ug via INTRAVENOUS

## 2019-06-13 MED ORDER — PROPOFOL 1000 MG/100ML IV EMUL: 5.0000 ug/kg/min | INTRAVENOUS | Status: DC

## 2019-06-13 MED ORDER — SODIUM CHLORIDE 0.9 % IV SOLN: 2.0000 g | Freq: Once | INTRAVENOUS | Status: DC

## 2019-06-13 MED ORDER — SODIUM CHLORIDE 0.9% FLUSH
3.0000 mL | Freq: Once | INTRAVENOUS | Status: AC
  Administered 2019-06-16: 3 mL via INTRAVENOUS

## 2019-06-13 MED ORDER — FENTANYL 2500MCG IN NS 250ML (10MCG/ML) PREMIX INFUSION
25.0000 ug/h | INTRAVENOUS | Status: DC
  Administered 2019-06-13: 185 ug/h via INTRAVENOUS
  Administered 2019-06-13: 25 ug/h via INTRAVENOUS
  Filled 2019-06-13: qty 250

## 2019-06-13 MED ORDER — NOREPINEPHRINE 4 MG/250ML-% IV SOLN
INTRAVENOUS | Status: AC
  Filled 2019-06-13: qty 250

## 2019-06-13 NOTE — ED Notes (Signed)
Pt wife to bedside. Says the patient has been uncomfortable wearing his cpap at night all this week and has been taking it off periodically at night.

## 2019-06-13 NOTE — ED Triage Notes (Signed)
Patient arrives via [facility]. Per medic report, the patient woke his wife up this morning c/o shortness of breath. Fire initiated BVM on their arrival. On arrival by EMS, the pt was responsive to painful stimuli. En route to facility, the pt mental status decompensated and he is non responsive on arrival to treatment room. HR 120's, 190/116, IV established in the left hand. Pt has had moderna vaccine x 2.

## 2019-06-13 NOTE — H&P (Signed)
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  Most of the history was taken from the wife and staff.  Patient was sedated with fentanyl but easily arousable and answers questions by nodding his head.  Patient was hypotensive with blood pressure of 58/47.  As per wife patient has been noncompliant with his CPAP over the last week or so and has been very frustrated with it due to stress is work.  He has not complained of any sputum, hemoptysis or fever.  As per wife patient was running his errands yesterday with no issues.  She states that he was told that he might need some more dilation of his subglottic stenosis..  Patient was a difficult intubation as per report.  Patient did get his 2 shots of vaccine.  Patient is also Covid negative today.  CT chest is showing bilateral pulmonary infiltrates.   Past Medical History  -Hypothyroidism -Obstructive sleep apnea -Subglottic stenosis -Deviated septum   Objective   Blood pressure (!) 80/66, pulse 80, temperature 98 F (36.7 C), resp. rate 18, weight 95.3 kg, SpO2 100 %.    Vent Mode: PRVC FiO2 (%):  [100 %] 100 % Set Rate:  [18 bmp] 18 bmp Vt Set:  RW:212346 mL] 620 mL PEEP:  [5 cmH20] 5 cmH20 Plateau Pressure:  [18 cmH20] 18 cmH20   Intake/Output Summary (Last 24 hours) at 06/13/2019 0700 Last data filed  at 06/13/2019 [id number] Gross per 24 hour  Intake 393.96 ml  Output --  Net 393.96 ml   Filed Weights   06/13/19 0552  Weight: 95.3 kg    Examination: General: Acutely ill on mechanical ventilation not in distress on 100% x 2 HENT: Orally intubated Lungs: Coarse crackles bilaterally right worse than left Cardiovascular: Normal heart sound no added sounds or murmurs Abdomen: Soft no tenderness no guarding Extremities: Warm with positive pulses Neuro: Sedated but easily arousable moving all extremities answering questions by nodding his head   Assessment & Plan:   Assessment: -Acute hypoxic respiratory failure require mechanical ventilation -Aspiration pneumonia/bilateral multifocal pneumonia -Bilateral infiltrates negative pressure pulmonary edema is a possibility but less likely -History of subglottic stenosis -Septic shock   Plan: -Adjust mechanical ventilation use lung protective strategy keep pulse ox above 92% -Sedate with fentanyl patient did drop his blood pressure with propofol -As needed Versed -Start Levophed and titrate to keep map above 65 -Central line insertion -Bolused with 1 L normal saline -Sputum culture -Ceftriaxone and Flagyl for aspiration pneumonia -Lovenox for DVT prophylaxis -N.p.o. for now -GI prophylaxis -Discussed goals of care patient is full code for now they will discuss with the rest of the family and get back to [place].  Labs   CBC: Recent Labs  Lab 06/13/19 0439 06/13/19 0501 06/13/19 0555  WBC 11.5*  --   --   HGB 18.3*  17.0 15.3  HCT 56.1* 50.0 45.0  MCV 96.4  --   --   PLT 302  --   --     Basic Metabolic Panel: Recent Labs  Lab 06/13/19 0440 06/13/19 0501 06/13/19 0555  NA 137 138 138  K 4.5 4.5 3.6  CL 102 102  --   CO2 23  --   --   GLUCOSE 262* 218*  --   BUN 18 26*  --   CREATININE 1.38* 1.40*  --   CALCIUM 9.4  --   --    GFR: CrCl cannot be calculated (Unknown ideal weight.). Recent Labs  Lab 06/13/19 0439   WBC 11.5*  LATICACIDVEN 5.6*    Liver Function Tests: Recent Labs  Lab 06/13/19 0440  AST 29  ALT 24  ALKPHOS 79  BILITOT 1.1  PROT 6.2*  ALBUMIN 3.6   No results for input(s): LIPASE, AMYLASE in the last 168 hours. No results for input(s): AMMONIA in the last 168 hours.  ABG    Component Value Date/Time   PHART 7.300 (L) 06/13/2019 0555   PCO2ART 50.8 (H) 06/13/2019 0555   PO2ART 73 (L) 06/13/2019 0555   HCO3 25.2 06/13/2019 0555   TCO2 27 06/13/2019 0555   ACIDBASEDEF 2.0 06/13/2019 0555   O2SAT 93.0 06/13/2019 0555     Coagulation Profile: Recent Labs  Lab 06/13/19 0440  INR 1.1    Cardiac Enzymes: No results for input(s): CKTOTAL, CKMB, CKMBINDEX, TROPONINI in the last 168 hours.  HbA1C: No results found for: HGBA1C  CBG: No results for input(s): GLUCAP in the last 168 hours.  Review of Systems:   Could not be obtained due to patient sedation  Past Medical History  He,  has a past medical history of Angio-edema and Eczema.   Surgical History    Past Surgical History:  Procedure Laterality Date  . PROSTATECTOMY N/A 2007     Social History   reports that he has never smoked. He has never used smokeless tobacco. He reports current alcohol use. He reports that he does not use drugs.   Family History   His family history includes Asthma in his father.   Allergies Allergies  Allergen Reactions  . Amoxicillin Rash  . Penicillins Rash     Home Medications  Prior to Admission medications   Medication Sig Start Date End Date Taking? Authorizing Provider  calcium-vitamin D 250-100 MG-UNIT tablet Take 1 tablet by mouth 2 (two) times daily.    [provider]  Fluticasone Propionate (XHANCE) 93 MCG/ACT EXHU Place 2 sprays into the nose 2 (two) times daily. 08/08/17   Kennith Gain, MD  levothyroxine (SYNTHROID, LEVOTHROID) 75 MCG tablet Take 75 mcg by mouth daily before breakfast.    [provider]     Critical  care time: I have spent 55 minutes of critical care time this time was spent at bedside or in the unit this time was exclusive of any billable procedures patient is needing intensive care due to complex medical problems requiring complex medical decisions.

## 2019-06-13 NOTE — Progress Notes (Signed)
eLink Physician-Brief Progress Note Patient Name: Chase Guzman DOB: [birth year] MRN: [id number]   Date of Service  06/13/2019  HPI/Events of Note  Request for KUB for feeding tube confirmation  eICU Interventions  Order placed     Intervention Category Intermediate Interventions: Other:  Margaretmary Lombard 06/13/2019, 9:59 PM

## 2019-06-13 NOTE — Progress Notes (Signed)
Pt transported from ED via ventilator with no complications noted. Wife and son at bedside during transport.

## 2019-06-13 NOTE — ED Provider Notes (Addendum)
Procedure Name: Intubation Date/Time: 06/13/2019 5:00 AM Performed by: Abigail Butts, PA-C Pre-anesthesia Checklist: Patient identified, Emergency Drugs available, Suction available, Patient being monitored and Timeout performed Oxygen Delivery Method: Ambu bag Preoxygenation: Pre-oxygenation with 100% oxygen Induction Type: Rapid sequence Ventilation: Mask ventilation with difficulty Laryngoscope Size: Mac and 4 Tube size: 7.5 mm Number of attempts: 1 Airway Equipment and Method: Video-laryngoscopy and Patient positioned with wedge pillow Placement Confirmation: ETT inserted through vocal cords under direct vision,  CO2 detector and Breath sounds checked- equal and bilateral Secured at: 23 cm Tube secured with: ETT holder Dental Injury: Teeth and Oropharynx as per pre-operative assessment  Difficulty Due To: Difficulty was anticipated Comments: 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 Recommend intubation by anesthesia for future events.          Tomer Chalmers, Gwenlyn Perking 06/13/19 0505    Palumbo, April, MD 06/13/19 8676

## 2019-06-13 NOTE — ED Notes (Signed)
Propofol was initiated for sedation, as patient was hypertensive, tachy, and restless. Pt became hypotensive, propofol was stopped, EDP notified. 500 NS bolus initiated. Sedation switched to Fentanyl. Pt is able to shake his head and squeeze hands to questions. Continues to be hypotensive. CCM at bedside during event, order for additional 500cc bolus, initiated levophed and 25 mcg fentanyl bolus.

## 2019-06-13 NOTE — Progress Notes (Signed)
Notified provider and bedside nurse of need to order repeat lactic acid.  

## 2019-06-13 NOTE — ED Provider Notes (Signed)
[facility] EMERGENCY DEPARTMENT Provider Note   CSN: [id number] Arrival date & time: 06/13/19  0430     History Chief Complaint  Patient presents with  . Shortness of Breath    Chase Guzman is a 74 y.o. male.  The history is provided by the EMS personnel. The history is limited by the condition of the patient.  Shortness of Breath Severity:  Severe Onset quality:  Sudden Timing:  Constant Progression:  Worsening Chronicity:  New Context: not URI   Relieved by:  Nothing Worsened by:  Nothing Ineffective treatments:  None tried Associated symptoms: no fever   Associated symptoms comment:  Went unresponsive  Risk factors: no recent alcohol use        Past Medical History:  Diagnosis Date  . Angio-edema   . Eczema     Patient Active Problem List   Diagnosis Date Noted  . OSA on CPAP 10/28/2013  . 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 Allergic rhinitis 04/14/2013  . Deviated nasal septum 04/14/2013  . Dysphonia 04/14/2013    Past Surgical History:  Procedure Laterality Date  . PROSTATECTOMY N/A 2007       Family History  Problem Relation Age of Onset  . Asthma Father     Social History   Tobacco Use  . Smoking status: Never Smoker  . Smokeless tobacco: Never Used  Substance Use Topics  . Alcohol use: Yes    Comment: occasional   . Drug use: Never    Home Medications Prior to Admission medications   Medication Sig Start Date End Date Taking? Authorizing Provider  calcium-vitamin D 250-100 MG-UNIT tablet Take 1 tablet by mouth 2 (two) times daily.    [provider]  Fluticasone Propionate (XHANCE) 93 MCG/ACT EXHU Place 2 sprays into the nose 2 (two) times daily. 08/08/17   Kennith Gain, MD  levothyroxine (SYNTHROID, LEVOTHROID) 75 MCG tablet Take 75 mcg by mouth daily before breakfast.    [provider]    Allergies    Amoxicillin and Penicillins  Review of Systems   Review of Systems    Constitutional: Negative for fever.  Respiratory: Positive for shortness of breath.     Physical Exam Updated Vital Signs BP 97/83   Pulse 100   Temp (!) 95.1 F (35.1 C)   Resp 18   SpO2 96%   Physical Exam Vitals and nursing note reviewed.  Constitutional:      Appearance: He is diaphoretic.  HENT:     Head: Normocephalic and atraumatic.     Right Ear: Tympanic membrane normal.     Left Ear: Tympanic membrane normal.     Nose: Nose normal.     Mouth/Throat:     Mouth: Mucous membranes are moist.     Pharynx: Oropharynx is clear.  Eyes:     Conjunctiva/sclera: Conjunctivae normal.  Cardiovascular:     Rate and Rhythm: Regular rhythm. Tachycardia present.     Pulses: Normal pulses.     Heart sounds: Normal heart sounds.  Pulmonary:     Effort: Bradypnea present.     Breath sounds: Decreased breath sounds present.  Abdominal:     General: Abdomen is flat. Bowel sounds are normal.     Tenderness: There is no abdominal tenderness.  Genitourinary:    Penis: Normal.   Musculoskeletal:     Cervical back: Neck supple. No rigidity.     Right lower leg: No edema.     Left lower leg: No  edema.  Lymphadenopathy:     Cervical: No cervical adenopathy.  Skin:    Capillary Refill: Capillary refill takes 2 to 3 seconds.  Neurological:     GCS: GCS eye subscore is 1. GCS verbal subscore is 1. GCS motor subscore is 1.     Deep Tendon Reflexes: Reflexes normal.  Psychiatric:     Comments: Unable      ED Results / Procedures / Treatments   Labs (all labs ordered are listed, but only abnormal results are displayed) Results for orders placed or performed during the hospital encounter of 06/13/19  SARS Coronavirus 2 by RT PCR (hospital order, performed in [facility] hospital lab) Nasopharyngeal Nasopharyngeal Swab   Specimen: Nasopharyngeal Swab  Result Value Ref Range   SARS Coronavirus 2 NEGATIVE NEGATIVE  Blood culture (routine x 2)   Specimen: BLOOD  Result Value Ref  Range   Specimen Description      BLOOD RIGHT IV START Performed at [facility], [address] [place], [place] [address]    Special Requests      BOTTLES DRAWN AEROBIC AND ANAEROBIC Blood Culture results may not be optimal due to an inadequate volume of blood received in culture bottles   Culture PENDING    Report Status PENDING   Blood culture (routine x 2)   Specimen: BLOOD  Result Value Ref Range   Specimen Description      BLOOD RIGHT IV START Performed at [facility] [address], [place], [place] [address]    Special Requests      BOTTLES DRAWN AEROBIC AND ANAEROBIC Blood Culture results may not be optimal due to an inadequate volume of blood received in culture bottles   Culture PENDING    Report Status PENDING   CBC  Result Value Ref Range   WBC 11.5 (H) 4.0 - 10.5 K/uL   RBC 5.82 (H) 4.22 - 5.81 MIL/uL   Hemoglobin 18.3 (H) 13.0 - 17.0 g/dL   HCT 56.1 (H) 39.0 - 52.0 %   MCV 96.4 80.0 - 100.0 fL   MCH 31.4 26.0 - 34.0 pg   MCHC 32.6 30.0 - 36.0 g/dL   RDW 12.8 11.5 - 15.5 %   Platelets 302 150 - 400 K/uL   nRBC 0.0 0.0 - 0.2 %  Lactic acid, plasma  Result Value Ref Range   Lactic Acid, Venous 5.6 (HH) 0.5 - 1.9 mmol/L  APTT  Result Value Ref Range   aPTT 32 24 - 36 seconds  Protime-INR  Result Value Ref Range   Prothrombin Time 13.6 11.4 - 15.2 seconds   INR 1.1 0.8 - 1.2  I-stat chem 8, ED (not at [facility] or [facility])  Result Value Ref Range   Sodium 138 135 - 145 mmol/L   Potassium 4.5 3.5 - 5.1 mmol/L   Chloride 102 98 - 111 mmol/L   BUN 26 (H) 8 - 23 mg/dL   Creatinine, Ser 1.40 (H) 0.61 - 1.24 mg/dL   Glucose, Bld 218 (H) 70 - 99 mg/dL   Calcium, Ion 1.17 1.15 - 1.40 mmol/L   TCO2 28 22 - 32 mmol/L   Hemoglobin 17.0 13.0 - 17.0 g/dL   HCT 50.0 39.0 - 52.0 %  I-STAT 7, (LYTES, BLD GAS, ICA, H+H)  Result Value Ref Range   pH, Arterial 7.300 (L) 7.350 - 7.450   pCO2 arterial 50.8 (H) 32.0 - 48.0 mmHg   pO2, Arterial 73 (L) 83.0 - 108.0  mmHg  Bicarbonate 25.2 20.0 - 28.0 mmol/L   TCO2 27 22 - 32 mmol/L   O2 Saturation 93.0 %   Acid-base deficit 2.0 0.0 - 2.0 mmol/L   Sodium 138 135 - 145 mmol/L   Potassium 3.6 3.5 - 5.1 mmol/L   Calcium, Ion 1.30 1.15 - 1.40 mmol/L   HCT 45.0 39.0 - 52.0 %   Hemoglobin 15.3 13.0 - 17.0 g/dL   Patient temperature 97.1 F    Collection site Radial    Drawn by RT    Sample type ARTERIAL    DG Chest Portable 1 View  Result Date: 06/13/2019 CLINICAL DATA:  Shortness of breath, post intubation EXAM: PORTABLE CHEST 1 VIEW COMPARISON:  None. FINDINGS: Endotracheal tube terminates 2 cm above the carina. Increased interstitial markings with lower lobe predominant opacities. This may reflect mild interstitial edema, although superimposed infection or aspiration is also possible given the lower lobe predominance. No definite pleural effusions. The heart is normal in size. Enteric tube terminates in the distal esophagus with side port in the mid esophagus. IMPRESSION: Endotracheal tube terminates 2 cm above the carina. Enteric tube terminates in the distal esophagus with side port in the mid esophagus. Advancement is suggested. Multifocal pulmonary opacities, possibly reflecting interstitial edema, although lower lobe predominance raises the possibility of superimposed infection/aspiration. Electronically Signed   By: Julian Hy M.D.   On: 06/13/2019 05:43    EKG  EKG Interpretation  Date/Time:  Sunday Jun 13 2019 04:34:50 EDT Ventricular Rate:  137 PR Interval:    QRS Duration: 102 QT Interval:  405 QTC Calculation: 612 R Axis:   0 Text Interpretation: Sinus tachycardia LAE, consider biatrial enlargement Abnormal R-wave progression, early transition Nonspecific repol abnormality, diffuse leads Prolonged QT interval Confirmed by Dory Horn) on 06/13/2019 6:23:23 AM       Radiology No results found.  Procedures Procedures (including critical care time)  Medications  Ordered in ED Medications  sodium chloride flush (NS) 0.9 % injection 3 mL (has no administration in time range)  metroNIDAZOLE (FLAGYL) IVPB 500 mg (has no administration in time range)  vancomycin (VANCOREADY) IVPB 2000 mg/400 mL (2,000 mg Intravenous New Bag/Given 06/13/19 0617)  ceFEPIme (MAXIPIME) 2 g in sodium chloride 0.9 % 100 mL IVPB (2 g Intravenous New Bag/Given 06/13/19 0557)  propofol (DIPRIVAN) 1000 MG/100ML infusion (10 mcg/kg/min  95.3 kg Intravenous New Bag/Given 06/13/19 0554)  fentaNYL 2520mcg in NS 211mL (79mcg/ml) infusion-PREMIX (has no administration in time range)  etomidate (AMIDATE) injection 20 mg (20 mg Intravenous Given 06/13/19 0427)  rocuronium (ZEMURON) injection 100 mg (100 mg Intravenous Given 06/13/19 0429)  iohexol (OMNIPAQUE) 350 MG/ML injection 100 mL (100 mLs Intravenous Contrast Given 06/13/19 0527)    ED Course  I have reviewed the triage vital signs and the nursing notes.  Pertinent labs & imaging results that were available during my care of the patient were reviewed by me and considered in my medical decision making (see chart for details).    See intubation note   MDM Reviewed: previous chart, nursing note and vitals Interpretation: labs, ECG, x-ray and CT scan (elevated lactate ETT in good position by me on cxr no dissection on CT by me negative covid . ) Total time providing critical care: 75-105 minutes (antibiotics and sedation post intubation ). This excludes time spent performing separately reportable procedures and services. Consults: critical care  CRITICAL CARE Performed by: Dolora Ridgely K Jedaiah Rathbun-Rasch Total critical care time: 75  minutes Critical care time was exclusive  of separately billable procedures and treating other patients. Critical care was necessary to treat or prevent imminent or life-threatening deterioration. Critical care was time spent personally by me on the following activities: development of treatment plan with patient  and/or surrogate as well as nursing, discussions with consultants, evaluation of patient's response to treatment, examination of patient, obtaining history from patient or surrogate, ordering and performing treatments and interventions, ordering and review of laboratory studies, ordering and review of radiographic studies, pulse oximetry and re-evaluation of patient's condition.  Final Clinical Impression(s) / ED Diagnoses Admit to ICU for respiratory arrest    Solina Heron, MD 06/13/19 [id number]

## 2019-06-13 NOTE — Sepsis Progress Note (Signed)
Notified provider of need to administer fluid bolus. 

## 2019-06-13 NOTE — Progress Notes (Signed)
Patient transported to from trauma C to CT and back with no complications.

## 2019-06-14 ENCOUNTER — Inpatient Hospital Stay (HOSPITAL_COMMUNITY): Payer: BLUE CROSS/BLUE SHIELD

## 2019-06-14 ENCOUNTER — Ambulatory Visit: Payer: Self-pay | Admitting: Dermatology

## 2019-06-14 DIAGNOSIS — J386 Stenosis of larynx: Secondary | ICD-10-CM | POA: Diagnosis not present

## 2019-06-14 DIAGNOSIS — I469 Cardiac arrest, cause unspecified: Secondary | ICD-10-CM | POA: Diagnosis not present

## 2019-06-14 DIAGNOSIS — J9601 Acute respiratory failure with hypoxia: Secondary | ICD-10-CM | POA: Diagnosis not present

## 2019-06-14 DIAGNOSIS — J939 Pneumothorax, unspecified: Secondary | ICD-10-CM | POA: Diagnosis not present

## 2019-06-14 DIAGNOSIS — D649 Anemia, unspecified: Secondary | ICD-10-CM | POA: Diagnosis not present

## 2019-06-14 DIAGNOSIS — Z4682 Encounter for fitting and adjustment of non-vascular catheter: Secondary | ICD-10-CM | POA: Diagnosis not present

## 2019-06-14 DIAGNOSIS — I462 Cardiac arrest due to underlying cardiac condition: Secondary | ICD-10-CM | POA: Diagnosis not present

## 2019-06-14 DIAGNOSIS — Z452 Encounter for adjustment and management of vascular access device: Secondary | ICD-10-CM | POA: Diagnosis not present

## 2019-06-14 DIAGNOSIS — Z9911 Dependence on respirator [ventilator] status: Secondary | ICD-10-CM | POA: Diagnosis not present

## 2019-06-14 DIAGNOSIS — S270XXD Traumatic pneumothorax, subsequent encounter: Secondary | ICD-10-CM | POA: Diagnosis not present

## 2019-06-14 DIAGNOSIS — J690 Pneumonitis due to inhalation of food and vomit: Secondary | ICD-10-CM | POA: Diagnosis not present

## 2019-06-14 DIAGNOSIS — R092 Respiratory arrest: Secondary | ICD-10-CM | POA: Diagnosis not present

## 2019-06-14 LAB — CBC WITH DIFFERENTIAL/PLATELET
Abs Immature Granulocytes: 0.04 10*3/uL (ref 0.00–0.07)
Basophils Absolute: 0.1 10*3/uL (ref 0.0–0.1)
Basophils Relative: 1 %
Eosinophils Absolute: 0 10*3/uL (ref 0.0–0.5)
Eosinophils Relative: 0 %
HCT: 37.3 % — ABNORMAL LOW (ref 39.0–52.0)
Hemoglobin: 12.6 g/dL — ABNORMAL LOW (ref 13.0–17.0)
Immature Granulocytes: 1 %
Lymphocytes Relative: 12 %
Lymphs Abs: 1.1 10*3/uL (ref 0.7–4.0)
MCH: 31 pg (ref 26.0–34.0)
MCHC: 33.8 g/dL (ref 30.0–36.0)
MCV: 91.6 fL (ref 80.0–100.0)
Monocytes Absolute: 0.7 10*3/uL (ref 0.1–1.0)
Monocytes Relative: 8 %
Neutro Abs: 7 10*3/uL (ref 1.7–7.7)
Neutrophils Relative %: 78 %
Platelets: 186 10*3/uL (ref 150–400)
RBC: 4.07 MIL/uL — ABNORMAL LOW (ref 4.22–5.81)
RDW: 13 % (ref 11.5–15.5)
WBC: 8.9 10*3/uL (ref 4.0–10.5)
nRBC: 0 % (ref 0.0–0.2)

## 2019-06-14 LAB — POCT I-STAT 7, (LYTES, BLD GAS, ICA,H+H)
Acid-base deficit: 2 mmol/L (ref 0.0–2.0)
Acid-base deficit: 1 mmol/L (ref 0.0–2.0)
Bicarbonate: 21.5 mmol/L (ref 20.0–28.0)
Bicarbonate: 22.9 mmol/L (ref 20.0–28.0)
Calcium, Ion: 1.27 mmol/L (ref 1.15–1.40)
Calcium, Ion: 1.29 mmol/L (ref 1.15–1.40)
HCT: 37 % — ABNORMAL LOW (ref 39.0–52.0)
HCT: 37 % — ABNORMAL LOW (ref 39.0–52.0)
Hemoglobin: 12.6 g/dL — ABNORMAL LOW (ref 13.0–17.0)
Hemoglobin: 12.6 g/dL — ABNORMAL LOW (ref 13.0–17.0)
O2 Saturation: 93 %
O2 Saturation: 99 %
Patient temperature: 98.9
Patient temperature: 98.6
Potassium: 3.5 mmol/L (ref 3.5–5.1)
Potassium: 4 mmol/L (ref 3.5–5.1)
Sodium: 139 mmol/L (ref 135–145)
Sodium: 142 mmol/L (ref 135–145)
TCO2: 23 mmol/L (ref 22–32)
TCO2: 24 mmol/L (ref 22–32)
pCO2 arterial: 33.7 mmHg (ref 32.0–48.0)
pCO2 arterial: 36.1 mmHg (ref 32.0–48.0)
pH, Arterial: 7.414 (ref 7.350–7.450)
pH, Arterial: 7.41 (ref 7.350–7.450)
pO2, Arterial: 67 mmHg — ABNORMAL LOW (ref 83.0–108.0)
pO2, Arterial: 114 mmHg — ABNORMAL HIGH (ref 83.0–108.0)

## 2019-06-14 LAB — BASIC METABOLIC PANEL
Anion gap: 6 (ref 5–15)
Anion gap: 7 (ref 5–15)
BUN: 18 mg/dL (ref 8–23)
BUN: 21 mg/dL (ref 8–23)
CO2: 21 mmol/L — ABNORMAL LOW (ref 22–32)
CO2: 25 mmol/L (ref 22–32)
Calcium: 8.1 mg/dL — ABNORMAL LOW (ref 8.9–10.3)
Calcium: 8.6 mg/dL — ABNORMAL LOW (ref 8.9–10.3)
Chloride: 112 mmol/L — ABNORMAL HIGH (ref 98–111)
Chloride: 110 mmol/L (ref 98–111)
Creatinine, Ser: 1.3 mg/dL — ABNORMAL HIGH (ref 0.61–1.24)
Creatinine, Ser: 1.27 mg/dL — ABNORMAL HIGH (ref 0.61–1.24)
GFR calc Af Amer: >60 mL/min (ref >60)
GFR calc Af Amer: >60 mL/min (ref >60)
GFR calc non Af Amer: 55 mL/min — ABNORMAL LOW (ref >60)
GFR calc non Af Amer: 56 mL/min — ABNORMAL LOW (ref >60)
Glucose, Bld: 108 mg/dL — ABNORMAL HIGH (ref 70–99)
Glucose, Bld: 111 mg/dL — ABNORMAL HIGH (ref 70–99)
Potassium: 3.7 mmol/L (ref 3.5–5.1)
Potassium: 4.1 mmol/L (ref 3.5–5.1)
Sodium: 139 mmol/L (ref 135–145)
Sodium: 142 mmol/L (ref 135–145)

## 2019-06-14 LAB — GLUCOSE, CAPILLARY
Glucose-Capillary: 102 mg/dL — ABNORMAL HIGH (ref 70–99)
Glucose-Capillary: 107 mg/dL — ABNORMAL HIGH (ref 70–99)
Glucose-Capillary: 108 mg/dL — ABNORMAL HIGH (ref 70–99)
Glucose-Capillary: 122 mg/dL — ABNORMAL HIGH (ref 70–99)
Glucose-Capillary: 77 mg/dL (ref 70–99)
Glucose-Capillary: 90 mg/dL (ref 70–99)
Glucose-Capillary: 91 mg/dL (ref 70–99)

## 2019-06-14 LAB — URINE CULTURE: Culture: NO GROWTH

## 2019-06-14 LAB — MAGNESIUM: Magnesium: 1.6 mg/dL — ABNORMAL LOW (ref 1.7–2.4)

## 2019-06-14 MED ORDER — SODIUM CHLORIDE 0.9 % IV SOLN
0.5000 mg/h | INTRAVENOUS | Status: DC
  Filled 2019-06-14: qty 5

## 2019-06-14 MED ORDER — HYDROMORPHONE HCL 1 MG/ML IJ SOLN
0.5000 mg | Freq: Once | INTRAMUSCULAR | Status: DC
  Filled 2019-06-14: qty 0.5

## 2019-06-14 MED ORDER — HYDROMORPHONE BOLUS VIA INFUSION
0.2000 mg | INTRAVENOUS | Status: DC | PRN
  Filled 2019-06-14: qty 1

## 2019-06-14 MED ORDER — MAGNESIUM SULFATE 2 GM/50ML IV SOLN
2.0000 g | Freq: Once | INTRAVENOUS | Status: AC
  Administered 2019-06-14: 2 g via INTRAVENOUS
  Filled 2019-06-14: qty 50

## 2019-06-14 MED ORDER — ALBUTEROL SULFATE (2.5 MG/3ML) 0.083% IN NEBU
2.5000 mg | INHALATION_SOLUTION | RESPIRATORY_TRACT | Status: DC | PRN
  Administered 2019-06-14 – 2019-06-17 (×2): 2.5 mg via RESPIRATORY_TRACT
  Filled 2019-06-14 (×2): qty 3

## 2019-06-14 MED ORDER — ALBUTEROL SULFATE (2.5 MG/3ML) 0.083% IN NEBU
INHALATION_SOLUTION | RESPIRATORY_TRACT | Status: AC
  Filled 2019-06-14: qty 3

## 2019-06-14 MED ORDER — VITAL HIGH PROTEIN PO LIQD
1000.0000 mL | ORAL | Status: DC
  Administered 2019-06-14 – 2019-06-18 (×2): 1000 mL

## 2019-06-14 MED ORDER — PRO-STAT SUGAR FREE PO LIQD
30.0000 mL | Freq: Two times a day (BID) | ORAL | Status: DC
  Administered 2019-06-14 – 2019-06-19 (×9): 30 mL
  Filled 2019-06-14 (×9): qty 30

## 2019-06-14 MED ORDER — VITAL HIGH PROTEIN PO LIQD: 1000.0000 mL | ORAL | Status: DC

## 2019-06-14 MED ORDER — HYDROMORPHONE HCL 1 MG/ML IJ SOLN
0.5000 mg | INTRAMUSCULAR | Status: DC | PRN
  Administered 2019-06-14 – 2019-06-17 (×7): 0.5 mg via INTRAVENOUS
  Filled 2019-06-14 (×6): qty 0.5

## 2019-06-14 MED ORDER — PANTOPRAZOLE SODIUM 40 MG PO PACK
40.0000 mg | PACK | Freq: Every day | ORAL | Status: DC
  Administered 2019-06-14 – 2019-06-19 (×5): 40 mg
  Filled 2019-06-14 (×7): qty 20

## 2019-06-14 MED ORDER — ACETAMINOPHEN 160 MG/5ML PO SOLN
500.0000 mg | Freq: Four times a day (QID) | ORAL | Status: DC | PRN
  Administered 2019-06-16: 500 mg via ORAL
  Filled 2019-06-14: qty 20.3

## 2019-06-14 MED FILL — Medication: Qty: 1 | Status: AC

## 2019-06-14 NOTE — Progress Notes (Signed)
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  Most of the history was taken from the wife and staff.  Patient was sedated with fentanyl but easily arousable and answers questions by nodding his head.  Patient was hypotensive with blood pressure of 58/47.  As per wife patient has been noncompliant with his CPAP over the last week or so and has been very frustrated with it due to stress is work.  He has not complained of any sputum, hemoptysis or fever.  As per wife patient was running his errands yesterday with no issues.  She states that he was told that he might need some more dilation of his subglottic stenosis.  Patient was a difficult intubation as per report.  Patient did get his 2 shots of vaccine.  Patient is also Covid negative today.  CT chest is showing bilateral pulmonary infiltrates.   Subglottic stenosis- managed at [facility] (Dr. Joya Gaskins; last seen 2019)- circumferential narrowing, inflammation w/o cancer on 2015 bx. Chronic dysphonia. Had stridor on exam at last 2019 ENT office visit. Had planned for SMDL with coblator ablation  in 2020 eval by Dr. Rogers Blocker in rheum due to concern for GPA  Past Medical History  -Hypothyroidism -Obstructive sleep apnea -Subglottic stenosis -Deviated septum  Micro:  5/23 blood cx>> 5/23 urine cx>> 5/23 covid  negative  Antibiotics:  Cefepime 5/23 vanc 5/23 ceftriaxone 5/24>> Flagyl 5/24>>  Significant diagnostic tests:  5/23 CT chest: dependent b/l LL infiltrates with air bronchograms, pulmonary edema. No PE.   Interval history  Levo off this morning with sedation off. 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  Objective   Blood pressure 96/65, pulse 72, temperature 99.3 F (37.4 C), resp. rate 15, height 5\' 8"  (1.727 m), weight 99.7 kg, SpO2 100 %.    Vent Mode: PSV;CPAP FiO2 (%):  [30 %] 30 % Set Rate:  [18 bmp] 18 bmp Vt Set:  [540 mL] 540 mL PEEP:  [5 cmH20] 5 cmH20 Pressure Support:  [5 cmH20] 5 cmH20 Plateau Pressure:  [15 cmH20-24 cmH20] 24 cmH20   Intake/Output Summary (Last 24 hours) at 06/14/2019 0843 Last data filed at 06/14/2019 0600 Gross per 24 hour  Intake 2248.63 ml  Output 925 ml  Net 1323.63 ml   Filed Weights   06/13/19 0552 06/13/19 0841 06/14/19 0421  Weight: 95.3 kg 98.7 kg 99.7 kg    Examination:  General: healthy appearing man laying in bed in NAD, appears stated age HENT: [place]/AT, eyes anicteric, oral mucosa moist, ETT in place. Lungs: CTAB anteriorly, decreased in b/l bases. Thick purulent and bloody sputum from ETT. Breathing comfortably on CPAP +  PSP  5/5 Cardiovascular: RRR, no murmurs  Abdomen: obese, soft, NT, ND Extremities: warm, dry, minimal pedal and hand edema Neuro:RASS =0, answering questions by nodding and writing. Moving all extremities spontaneously Derm: no rashes or wounds   Assessment & Plan:  Acute hypoxic respiratory failure require mechanical ventilation B/l LL aspiration pneumonia Chronic progressive subglottic stenosis; prev followed at [facility] but wants to establish with a [facility] -Con't antibiotics for aspiration pneumonia-  ceftriaxone and flagyl vs unasyn dependent on allergy history. -trach aspirate today -ENT consult -con't vent support until evaluated by ENT; high risk for failed extubation for acute on chronic airway inflammation -will discuss with ENT role for dexamethasone -titrate PEEP & FIO2 as able to maintain SpO2 >88% -LTVV; will likely do well on PS+ CPAP  Prolonged QTc -repeat EKG -switch pepcid to pantoprazole -change fentantyl to dilaudid -avoid QTc prolonging meds -Mg level pending  Acute anemia, possibly dilutional  -con't to monitor -transfuse for Hb <7 or hemodynamically significant bleeding  Lactic acidosis likely 2/2 pneumonia, possibly hypoxia- now normalized AG  Best practice: GI prophylaxis: pantoprazole DVT prophylaxis: lovenox fam- wife at bedside  Labs   CBC: Recent Labs  Lab 06/13/19 0439 06/13/19 0501 06/13/19 0555 06/13/19 1103 06/14/19 0749  WBC 11.5*  --   --   --  8.9  NEUTROABS  --   --   --   --  7.0  HGB 18.3* 17.0 15.3 14.6 12.6*  HCT 56.1* 50.0 45.0 43.0 37.3*  MCV 96.4  --   --   --  91.6  PLT 302  --   --   --  99991111    Basic Metabolic Panel: Recent Labs  Lab 06/13/19 0440 06/13/19 0501 06/13/19 0555 06/13/19 1103  NA 137 138 138 139  K 4.5 4.5 3.6 4.6  CL 102 102  --   --   CO2 23  --   --   --   GLUCOSE 262* 218*  --   --   BUN 18 26*  --   --   CREATININE 1.38* 1.40*  --   --   CALCIUM 9.4  --   --   --    GFR: Estimated Creatinine Clearance: 54.6 mL/min (A) (by C-G formula based on SCr of 1.4 mg/dL (H)). Recent Labs  Lab 06/13/19 0439 06/13/19 0638 06/13/19 1146 06/14/19 0749  WBC 11.5*  --   --  8.9  LATICACIDVEN 5.6* 5.8* 3.6*  --     Liver Function Tests: Recent Labs  Lab 06/13/19 0440  AST 29  ALT 24  ALKPHOS 79  BILITOT 1.1  PROT 6.2*  ALBUMIN 3.6   No results for input(s): LIPASE, AMYLASE in the last 168 hours. No results for input(s): AMMONIA in the last 168 hours.  ABG    Component Value Date/Time    PHART 7.319 (L) 06/13/2019 1103   PCO2ART 36.2 06/13/2019 1103   PO2ART 79 (L) 06/13/2019 1103   HCO3 18.7 (L) 06/13/2019 1103   TCO2 20 (L) 06/13/2019 1103   ACIDBASEDEF 7.0 (H) 06/13/2019 1103   O2SAT 95.0 06/13/2019 1103     Coagulation Profile: Recent Labs  Lab 06/13/19 0440  INR 1.1    Cardiac Enzymes: No results for input(s): CKTOTAL, CKMB, CKMBINDEX, TROPONINI in the last 168 hours.  HbA1C: No results found for: HGBA1C  CBG: Recent Labs  Lab 06/13/19 1515 06/13/19 1919 06/14/19 0022 06/14/19 0337 06/14/19 0723  GLUCAP 85 97 107* 102* 90  This patient is critically ill with multiple organ system failure which requires frequent high complexity decision making, assessment, support, evaluation, and titration of therapies. This was completed through the application of advanced monitoring technologies and extensive interpretation of multiple databases. During this encounter critical care time was devoted to patient care services described in this note for 45 minutes.   Julian Hy, DO 06/14/19 9:45 AM [facility]

## 2019-06-14 NOTE — Significant Event (Signed)
Called to bedside. Patient coughed, then desaturated and unable to pass ballard. ETT removed and BVM initiated. Bradycardic arrest. Chest compressions started. Intubated with glidescope S4, 1 attempt, grade 1 view. ROSC achieved. Medications administered per code documentation. Continued aggressive BV, then reconnected to the vent on PEEP 10, FiO2 100%.  Patient now awake, back to previous baseline mental status. Son and wife were at bedside when this occurred and updated.  Julian Hy, DO 06/14/19 7:01 PM [facility]

## 2019-06-14 NOTE — Procedures (Signed)
Intubation Procedure Note Sorin Frimpong [id number] [birth year]  Procedure: Intubation Indications: Airway protection and maintenance  Procedure Details Consent: Unable to obtain consent because of emergent medical necessity. Time Out: Verified patient identification, verified procedure, site/side was marked, verified correct patient position, special equipment/implants available, medications/allergies/relevent history reviewed, required imaging and test results available.   Maximum sterile technique was used including gloves, hand hygiene and mask.  MAC and 4 glidescope S4  1 attempt, grade 1 view, subglottic edema but patent  No medications administererd   Evaluation Hemodynamic Status: BP stable throughout; O2 sats: low before procedure, improved after Patient's Current Condition: stable Complications: No apparent complications Patient did tolerate procedure well. Chest X-ray ordered to verify placement.  CXR: tube position acceptable.   Julian Hy 06/14/2019

## 2019-06-14 NOTE — Progress Notes (Addendum)
eLink Physician-Brief Progress Note Patient Name: [name] DOB: 09-08-1945 MRN: [id number]   Date of Service  06/14/2019  HPI/Events of Note  Foley out since am. Scrotal swelling. No record of how much UOP since AM. Did had condom cath till noon. No ecchymosis or bleeding signs from truma.  Camera: Fully alert, working on his cel phone. sats 100%.  ABG reviewed, good.   eICU Interventions  - get BMP, bladder scan. - US scrotum, urology consultation in AM if swelling persist.  - if retension, need foley. - get CxR stat and follow up CxR in AM for Pneumothorax.  - discussed with bed side RN.      Intervention Category Intermediate Interventions: Other:;Oliguria - evaluation and management  Elmer Sow 06/14/2019, 10:53 PM   1:35 CxR: improving Left moderate to small apical pneumo, stable left chest sub cut emphysema. Rt pneumo resolved. ET and now has NG tube.

## 2019-06-14 NOTE — Progress Notes (Signed)
I responded to a Code [employer] and visited the patient's room to provide spiritual support for the patient's family and staff. I provided pastoral presence with the staff until after the code was cleared. I shared that the Chaplain is available for additional support as needed or requested.    06/14/19 1740  Clinical Encounter Type  Visited With Patient not available  Visit Type Code;Spiritual support  Referral From Nurse  Consult/Referral To Chaplain  Spiritual Encounters  Spiritual Needs Prayer    Chaplain Dr Redgie Grayer

## 2019-06-14 NOTE — Progress Notes (Signed)
eLink Physician-Brief Progress Note Patient Name: Chase Guzman DOB: May 16, 1945 MRN: [id number]   Date of Service  06/14/2019  HPI/Events of Note  Radiology notified about bilateral moderate pneumo, sub cut emphysema. Film reviewed.  Camera: Discussed with bed side RN and RT. And Dr Ulla Gallo MD.   Resp failure, sepsis-mostly asp pneumonia. Hx of sub glottic stenosis. Self extubated earlier, briefly coded. Now he is fully alert. Obese. Stable VS. sats 100% on 540/01/10/59%.   eICU Interventions  - Change Vent to full PCV. 14/5 ( total 19), rate 20. sats still at 100%. Ve 17 lit.  - follow ABG ain 30 mins, CxR in an hour. If worsening, need chest tube.      Intervention Category Major Interventions: Respiratory failure - evaluation and management;Acid-Base disturbance - evaluation and management Intermediate Interventions: Diagnostic test evaluation Minor Interventions: Communication with other healthcare providers and/or family  Elmer Sow 06/14/2019, 7:34 PM

## 2019-06-14 NOTE — Progress Notes (Signed)
eLink Physician-Brief Progress Note Patient Name: Lukus Neuhoff DOB: 1945/10/09 MRN: [id number]   Date of Service  06/14/2019  HPI/Events of Note  AM ;labs requested  eICU Interventions  Ordered     Intervention Category Minor Interventions: Clinical assessment - ordering diagnostic tests  Margaretmary Lombard 06/14/2019, 6:05 AM

## 2019-06-14 NOTE — Progress Notes (Signed)
This RN was in room with patient pulling him up in bed with the assistance of another RN. Pt coughed and when was sat up in bed after mobility wrote out on a piece of paper "I can't breathe." This had been his mode of communication throughout the day and was able to communicate clearly.  This RN looked at oxygen saturations and they were in mid to high 90s. RN gave patient a O2 breath on ventilator to prepare for in-line suction. RN was unable to advance in-line suction catheter all the way and patient was beginning to get more anxious. Another attempt was made at passing and was unsuccessful. Pt became very anxious and continued to say he couldn't breathe, visibly in distress. RN called out for someone to try to advance catheter and for someone to call respiratory. Another RN attempted in-line suction and was unsuccessful. Respiratory at bedside, attempted to suction patient and was unsuccessful.   At this time, patients oxygen levels were dropping and he was beginning to become increasingly lethargic. Dr. Carlis Abbott was paged to come to bedside stat. Patient began to become bradycardic in the 30 then 20s, skin color was grey and then became unresponsive. Code Blue was called and this RN initiated CPR.   First pulse check was positive for a femoral pulse. ET tube was taken out and replaced by Dr. Carlis Abbott. Pts oxygen levels increased back to normal limits. RN assessed patient after Code Blue had ended. He had bilateral breath sounds throughout, able to follow commands and was back at baseline mental status.

## 2019-06-14 NOTE — Progress Notes (Signed)
Initial Nutrition Assessment  DOCUMENTATION CODES:   Obesity unspecified  INTERVENTION:   Initiate tube feeding via OG tube: Vital High Protein at 55 ml/h (1320 ml per day) Pro-stat 30 ml BID  Provides 1520 kcal, 146 gm protein, 1104 ml free water daily  NUTRITION DIAGNOSIS:   Inadequate oral intake related to inability to eat as evidenced by NPO status.  GOAL:   Provide needs based on ASPEN/SCCM guidelines  MONITOR:   Vent status, TF tolerance, Labs  REASON FOR ASSESSMENT:   Ventilator, Consult Enteral/tube feeding initiation and management  ASSESSMENT:   74 yo male admitted with SOB. Intubated on admission. PMH includes subglottic stenosis, OSA, hypothyroidism.   Discussed patient in ICU rounds and with RN today. Received MD Consult for TF initiation and management. OG tube in place.  Patient is currently intubated on ventilator support, levophed stopped this morning. MV: 10.7 L/min Temp (24hrs), Avg:99.7 F (37.6 C), Min:98.9 F (37.2 C), Max:101.5 F (38.6 C)   Labs reviewed.  CBG: 107-102-90  Medications reviewed and include colace, miralax, fentanyl, IV flagyl.   No recent weight history available for review.   NUTRITION - FOCUSED PHYSICAL EXAM:    Most Recent Value  Orbital Region  No depletion  Upper Arm Region  No depletion  Thoracic and Lumbar Region  No depletion  Buccal Region  No depletion  Temple Region  No depletion  Clavicle Bone Region  No depletion  Clavicle and Acromion Bone Region  No depletion  Scapular Bone Region  No depletion  Dorsal Hand  No depletion  Patellar Region  No depletion  Anterior Thigh Region  No depletion  Posterior Calf Region  No depletion  Edema (RD Assessment)  None  Hair  Reviewed  Eyes  Reviewed  Mouth  Unable to assess  Skin  Reviewed  Nails  Reviewed       Diet Order:   Diet Order            Diet NPO time specified  Diet effective now              EDUCATION NEEDS:   Not appropriate  for education at this time  Skin:  Skin Assessment: Reviewed RN Assessment  Last BM:  no BM documented  Height:   Ht Readings from Last 1 Encounters:  06/13/19 5\' 8"  (1.727 m)    Weight:   Wt Readings from Last 1 Encounters:  06/14/19 99.7 kg   Admission weight 98.7 kg (BMI=33.1)  Ideal Body Weight:  70 kg  BMI:  Body mass index is 33.42 kg/m.  Estimated Nutritional Needs:   Kcal:  1325-1550  Protein:  140 gm  Fluid:  >/= 2 L    Lucas Mallow, RD, LDN, CNSC Please refer to Amion for contact information.

## 2019-06-15 ENCOUNTER — Inpatient Hospital Stay (HOSPITAL_COMMUNITY): Payer: BLUE CROSS/BLUE SHIELD

## 2019-06-15 DIAGNOSIS — R9431 Abnormal electrocardiogram [ECG] [EKG]: Secondary | ICD-10-CM

## 2019-06-15 DIAGNOSIS — Z9911 Dependence on respirator [ventilator] status: Secondary | ICD-10-CM

## 2019-06-15 DIAGNOSIS — J939 Pneumothorax, unspecified: Secondary | ICD-10-CM

## 2019-06-15 LAB — BASIC METABOLIC PANEL
Anion gap: 6 (ref 5–15)
BUN: 23 mg/dL (ref 8–23)
CO2: 23 mmol/L (ref 22–32)
Calcium: 8.5 mg/dL — ABNORMAL LOW (ref 8.9–10.3)
Chloride: 110 mmol/L (ref 98–111)
Creatinine, Ser: 1.09 mg/dL (ref 0.61–1.24)
GFR calc Af Amer: >60 mL/min (ref >60)
GFR calc non Af Amer: >60 mL/min (ref >60)
Glucose, Bld: 120 mg/dL — ABNORMAL HIGH (ref 70–99)
Potassium: 3.7 mmol/L (ref 3.5–5.1)
Sodium: 139 mmol/L (ref 135–145)

## 2019-06-15 LAB — CBC
HCT: 38.5 % — ABNORMAL LOW (ref 39.0–52.0)
Hemoglobin: 12.8 g/dL — ABNORMAL LOW (ref 13.0–17.0)
MCH: 30.7 pg (ref 26.0–34.0)
MCHC: 33.2 g/dL (ref 30.0–36.0)
MCV: 92.3 fL (ref 80.0–100.0)
Platelets: 171 10*3/uL (ref 150–400)
RBC: 4.17 MIL/uL — ABNORMAL LOW (ref 4.22–5.81)
RDW: 13.3 % (ref 11.5–15.5)
WBC: 7.8 10*3/uL (ref 4.0–10.5)
nRBC: 0 % (ref 0.0–0.2)

## 2019-06-15 LAB — GLUCOSE, CAPILLARY
Glucose-Capillary: 147 mg/dL — ABNORMAL HIGH (ref 70–99)
Glucose-Capillary: 156 mg/dL — ABNORMAL HIGH (ref 70–99)
Glucose-Capillary: 172 mg/dL — ABNORMAL HIGH (ref 70–99)
Glucose-Capillary: 88 mg/dL (ref 70–99)
Glucose-Capillary: 89 mg/dL (ref 70–99)
Glucose-Capillary: 98 mg/dL (ref 70–99)

## 2019-06-15 MED ORDER — FENTANYL CITRATE (PF) 100 MCG/2ML IJ SOLN
INTRAMUSCULAR | Status: AC
  Administered 2019-06-15: 100 ug
  Filled 2019-06-15: qty 2

## 2019-06-15 MED ORDER — METHYLPREDNISOLONE SODIUM SUCC 125 MG IJ SOLR
40.0000 mg | Freq: Four times a day (QID) | INTRAMUSCULAR | Status: DC
  Administered 2019-06-15 – 2019-06-20 (×21): 40 mg via INTRAVENOUS
  Filled 2019-06-15 (×21): qty 2

## 2019-06-15 MED ORDER — DOXAZOSIN MESYLATE 2 MG PO TABS
2.0000 mg | ORAL_TABLET | Freq: Every day | ORAL | Status: DC
  Administered 2019-06-15 – 2019-06-17 (×3): 2 mg via ORAL
  Filled 2019-06-15 (×3): qty 1

## 2019-06-15 MED ORDER — LIDOCAINE HCL (PF) 1 % IJ SOLN
INTRAMUSCULAR | Status: AC
  Filled 2019-06-15: qty 5

## 2019-06-15 MED ORDER — OXYCODONE HCL 5 MG PO TABS
5.0000 mg | ORAL_TABLET | Freq: Four times a day (QID) | ORAL | Status: DC | PRN
  Administered 2019-06-15 – 2019-06-19 (×2): 5 mg via ORAL
  Filled 2019-06-15 (×3): qty 1

## 2019-06-15 MED ORDER — FINASTERIDE 5 MG PO TABS
5.0000 mg | ORAL_TABLET | Freq: Every day | ORAL | Status: DC
  Administered 2019-06-15 – 2019-06-22 (×6): 5 mg via ORAL
  Filled 2019-06-15 (×10): qty 1

## 2019-06-15 NOTE — Procedures (Signed)
Chest Tube Insertion Procedure Note  Indications:  Clinically significant Pneumothorax  Pre-operative Diagnosis: Pneumothorax  Post-operative Diagnosis: Pneumothorax  Procedure Details  Informed consent was obtained for the procedure, including sedation.  Risks of lung perforation, hemorrhage, arrhythmia, and adverse drug reaction were discussed.   After sterile skin prep, using standard technique, a 14 French tube was placed in the right anterior rib space.  Findings: Air, chest tube tidaling  Estimated Blood Loss:  Minimal         Specimens:  None              Complications:  None; patient tolerated the procedure well.         Disposition: ICU - intubated and critically ill.         Condition: stable  Attending Attestation: I performed the procedure.  Julian Hy, DO 06/15/19 11:07 AM [facility]

## 2019-06-15 NOTE — Procedures (Signed)
Chest Tube Insertion Procedure Note  Indications:  Clinically significant Pneumothorax  Pre-operative Diagnosis: Pneumothorax  Post-operative Diagnosis: Pneumothorax  Procedure Details  Informed consent was obtained for the procedure, including sedation.  Risks of lung perforation, hemorrhage, arrhythmia, and adverse drug reaction were discussed.   After sterile skin prep, using standard technique, a 14 French tube was placed in the left anterior 3 rib space.  Findings: Air rush with placement of needle.  No continuous air leak in [facility]    Estimated Blood Loss:  Minimal         Specimens:  None              Complications:  None; patient tolerated the procedure well.         Disposition: ICU - intubated and hemodynamically stable.         Condition: stable   Attending Attestation:

## 2019-06-15 NOTE — Progress Notes (Addendum)
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  Most of the history was taken from the wife and staff.  Patient was sedated with fentanyl but easily arousable and answers questions by nodding his head.  Patient was hypotensive with blood pressure of 58/47.  As per wife patient has been noncompliant with his CPAP over the last week or so and has been very frustrated with it due to stress is work.  He has not complained of any sputum, hemoptysis or fever.  As per wife patient was running his errands yesterday with no issues.  She states that he was told that he might need some more dilation of his subglottic stenosis.  Patient was a difficult intubation as per report.  Patient did get his 2 shots of vaccine.  Patient is also Covid negative today.  CT chest is showing bilateral pulmonary infiltrates.   Subglottic stenosis- managed at [facility] (Dr. Joya Gaskins; last seen 2019)- circumferential narrowing, inflammation w/o cancer on 2015 bx. Chronic dysphonia. Had stridor on exam at last 2019 ENT office visit. Had planned for SMDL with coblator ablation  in 2020 eval by Dr. Rogers Blocker in rheum due to concern for GPA  Past Medical History  -Hypothyroidism -Obstructive sleep apnea -Subglottic stenosis -Deviated septum  Micro:  5/23 blood cx>> 5/23 urine cx>> 5/23 covid  negative  Antibiotics:  Cefepime 5/23 vanc 5/23 ceftriaxone 5/24>> Flagyl 5/24>>  Significant diagnostic tests:  5/23 CT chest: dependent b/l LL infiltrates with air bronchograms, pulmonary edema. No PE.   Interval history  Small pneumothoraxes discovered overnight- enlarging on this morning's CXR. Cardiac arrest yesterday after mucus plug requiring reintubation.  Objective   Blood pressure 125/84, pulse 82, temperature 98.9 F (37.2 C), temperature source Oral, resp. rate (!) 24, height 5\' 8"  (1.727 m), weight 98.2 kg, SpO2 97 %.    Vent Mode: PCV FiO2 (%):  [30 %-60 %] 40 % Set Rate:  [20 bmp] 20 bmp Vt Set:  [540 mL] 540 mL PEEP:  [5 cmH20-8 cmH20] 5 cmH20 Pressure Support:  [5 cmH20] 5 cmH20 Plateau Pressure:  [15 cmH20-22 cmH20] 15 cmH20   Intake/Output Summary (Last 24 hours) at 06/15/2019 0851 Last data filed at 06/15/2019 0600 Gross per 24 hour  Intake 440.77 ml  Output 670 ml  Net -229.23 ml   Filed Weights   06/13/19 0841 06/14/19 0421 06/15/19 0500  Weight: 98.7 kg 99.7 kg 98.2 kg    Examination:  General: healthy appearing man sitting up in bed communicating with family, intubated, not sedated HENT: [place]/AT, eyes anicteric, oral mucosa moist, ETT in place Lungs: bloody thinner secretions from ETT, no cuff leak from ETT  Cardiovascular: RRR, no murmurs Abdomen: obese soft, NT Extremities: warm & dry, minimal edema Neuro: RASS 0, communicating via writing, very interactive. Moving all extremities spontaneously Derm: no rashes or  wounds   Assessment & Plan:  Bilateral enlarging pneumothoraces post CPR -bilateral chest tubes to be placed today.  Acute hypoxic respiratory failure require mechanical ventilation B/l LL aspiration pneumonia Chronic progressive subglottic stenosis; prev followed at [facility] but wants to establish with a [facility]NT. No cuff leak on exam this morning. -Con't antibiotics for aspiration pneumonia- con't ceftriaxone. Stopped flagyl  previously due to QTc prolongation -ENT consulted- discussed case over the phone. If not able to be extubated safely to facilitate subglottic stenosis interventions, he will require tracheostomy, which could complicate management for this long-term. Prolonged discussion at bedside about possible tracheostomy. Family understands my concern that trach could complicate long-term SG stenosis management and I cannot be specific about the risk of needing trach long-term. ENT could elaborate more if it comes to this. -bougie at bedside in case he has a significant mucus plug causing tube occlusion again -steroids for inflammation to determine if there is a reversible component to airway inflammation -con't vent support; high risk for failed extubation for acute on chronic airway inflammation -titrate PEEP & FIO2 as able to maintain SpO2 >88% -LTVV; has been doing well on PS+ CPAP  Cardiac arrest- bradycardic, due to mucus plugging of ETT -con't tele monitoring -flush ETT with saline Qshift  Prolonged QTc- improving -repeat EKG -con't PPI, avoid pepcid -change fentantyl to dilaudid -avoid QTc prolonging meds  Acute anemia, possibly dilutional - stable -con't to monitor -transfuse for Hb <7 or hemodynamically significant bleeding  Lactic acidosis likely 2/2 pneumonia, possibly hypoxia- now normalized AG  Best practice: GI prophylaxis: pantoprazole DVT prophylaxis: lovenox fam- wife & son updated at bedside  Labs   CBC: Recent Labs  Lab 06/13/19 0439 06/13/19 0501 06/13/19 1103 06/14/19 0749 06/14/19 1907 06/14/19 2058 06/15/19 0338  WBC 11.5*  --   --  8.9  --   --  7.8  NEUTROABS  --   --   --  7.0  --   --   --   HGB 18.3*   < > 14.6 12.6* 12.6* 12.6* 12.8*  HCT 56.1*   < > 43.0 37.3* 37.0* 37.0* 38.5*  MCV 96.4  --   --  91.6  --   --  92.3  PLT 302  --   --  186  --   --  171   < > = values in this interval not displayed.    Basic Metabolic Panel: Recent Labs  Lab  06/13/19 0440 06/13/19 0440 06/13/19 0501 06/13/19 0555 06/14/19 0749 06/14/19 1907 06/14/19 2058 06/14/19 2306 06/15/19 0338  NA 137   < > 138   < > 139 139 142 142 139  K 4.5   < > 4.5   < > 3.7 3.5 4.0 4.1 3.7  CL 102  --  102  --  112*  --   --  110 110  CO2 23  --   --   --  21*  --   --  25 23  GLUCOSE 262*  --  218*  --  108*  --   --  111* 120*  BUN 18  --  26*  --  18  --   --  21 23  CREATININE 1.38*  --  1.40*  --  1.30*  --   --  1.27* 1.09  CALCIUM 9.4  --   --   --  8.1*  --   --  8.6* 8.5*  MG  --   --   --   --  1.6*  --   --   --   --    < > = values in this interval not displayed.   GFR: Estimated Creatinine Clearance: 69.6 mL/min (by C-G formula based on SCr of 1.09 mg/dL). Recent Labs  Lab 06/13/19 0439 06/13/19 0638 06/13/19 1146 06/14/19 0749 06/15/19 0338  WBC 11.5*  --   --  8.9 7.8  LATICACIDVEN 5.6* 5.8* 3.6*  --   --     Liver Function Tests: Recent Labs  Lab 06/13/19 0440  AST 29  ALT 24  ALKPHOS 79  BILITOT 1.1  PROT 6.2*  ALBUMIN 3.6   No results for input(s): LIPASE, AMYLASE in the last 168 hours. No results for input(s): AMMONIA in the last 168 hours.  ABG    Component Value Date/Time   PHART 7.410 06/14/2019 2058   PCO2ART 36.1 06/14/2019 2058   PO2ART 114 (H) 06/14/2019 2058   HCO3 22.9 06/14/2019 2058   TCO2 24 06/14/2019 2058   ACIDBASEDEF 1.0 06/14/2019 2058   O2SAT 99.0 06/14/2019 2058     Coagulation Profile: Recent Labs  Lab 06/13/19 0440  INR 1.1    Cardiac Enzymes: No results for input(s): CKTOTAL, CKMB, CKMBINDEX, TROPONINI in the last 168 hours.  HbA1C: No results found for: HGBA1C  CBG: Recent Labs  Lab 06/14/19 1502 06/14/19 1936 06/14/19 2321 06/15/19 0346 06/15/19 0708  GLUCAP 77 122* 108* 98 88     This patient is critically ill with multiple organ system failure which requires frequent high complexity decision making, assessment, support, evaluation, and titration of therapies. This  was completed through the application of advanced monitoring technologies and extensive interpretation of multiple databases. During this encounter critical care time was devoted to patient care services described in this note for 65 minutes.   Julian Hy, DO 06/15/19 8:51 AM [facility]

## 2019-06-16 ENCOUNTER — Inpatient Hospital Stay (HOSPITAL_COMMUNITY): Payer: BLUE CROSS/BLUE SHIELD

## 2019-06-16 DIAGNOSIS — S270XXD Traumatic pneumothorax, subsequent encounter: Secondary | ICD-10-CM

## 2019-06-16 DIAGNOSIS — R739 Hyperglycemia, unspecified: Secondary | ICD-10-CM

## 2019-06-16 DIAGNOSIS — J9601 Acute respiratory failure with hypoxia: Secondary | ICD-10-CM | POA: Diagnosis not present

## 2019-06-16 DIAGNOSIS — Z9911 Dependence on respirator [ventilator] status: Secondary | ICD-10-CM | POA: Diagnosis not present

## 2019-06-16 DIAGNOSIS — J386 Stenosis of larynx: Secondary | ICD-10-CM | POA: Diagnosis not present

## 2019-06-16 DIAGNOSIS — D649 Anemia, unspecified: Secondary | ICD-10-CM

## 2019-06-16 DIAGNOSIS — J939 Pneumothorax, unspecified: Secondary | ICD-10-CM | POA: Diagnosis not present

## 2019-06-16 DIAGNOSIS — R918 Other nonspecific abnormal finding of lung field: Secondary | ICD-10-CM | POA: Diagnosis not present

## 2019-06-16 LAB — GLUCOSE, CAPILLARY
Glucose-Capillary: 150 mg/dL — ABNORMAL HIGH (ref 70–99)
Glucose-Capillary: 155 mg/dL — ABNORMAL HIGH (ref 70–99)
Glucose-Capillary: 146 mg/dL — ABNORMAL HIGH (ref 70–99)
Glucose-Capillary: 134 mg/dL — ABNORMAL HIGH (ref 70–99)
Glucose-Capillary: 111 mg/dL — ABNORMAL HIGH (ref 70–99)
Glucose-Capillary: 186 mg/dL — ABNORMAL HIGH (ref 70–99)

## 2019-06-16 LAB — BASIC METABOLIC PANEL
Anion gap: 9 (ref 5–15)
BUN: 30 mg/dL — ABNORMAL HIGH (ref 8–23)
CO2: 24 mmol/L (ref 22–32)
Calcium: 8.7 mg/dL — ABNORMAL LOW (ref 8.9–10.3)
Chloride: 108 mmol/L (ref 98–111)
Creatinine, Ser: 1.09 mg/dL (ref 0.61–1.24)
GFR calc Af Amer: >60 mL/min (ref >60)
GFR calc non Af Amer: >60 mL/min (ref >60)
Glucose, Bld: 187 mg/dL — ABNORMAL HIGH (ref 70–99)
Potassium: 4 mmol/L (ref 3.5–5.1)
Sodium: 141 mmol/L (ref 135–145)

## 2019-06-16 LAB — CULTURE, RESPIRATORY W GRAM STAIN: Culture: NORMAL

## 2019-06-16 LAB — LEGIONELLA PNEUMOPHILA SEROGP 1 UR AG: L. pneumophila Serogp 1 Ur Ag: NEGATIVE

## 2019-06-16 MED ORDER — SENNOSIDES-DOCUSATE SODIUM 8.6-50 MG PO TABS
1.0000 | ORAL_TABLET | Freq: Every day | ORAL | Status: DC
  Administered 2019-06-16 – 2019-06-18 (×2): 1
  Filled 2019-06-16 (×3): qty 1

## 2019-06-16 MED ORDER — ACETAMINOPHEN 160 MG/5ML PO SOLN
500.0000 mg | Freq: Four times a day (QID) | ORAL | Status: DC | PRN
  Administered 2019-06-17 – 2019-06-18 (×3): 500 mg
  Filled 2019-06-16 (×3): qty 20.3

## 2019-06-16 MED ORDER — INSULIN ASPART 100 UNIT/ML ~~LOC~~ SOLN
2.0000 [IU] | SUBCUTANEOUS | Status: DC
  Administered 2019-06-16: 4 [IU] via SUBCUTANEOUS
  Administered 2019-06-17 – 2019-06-19 (×13): 2 [IU] via SUBCUTANEOUS
  Administered 2019-06-20: 4 [IU] via SUBCUTANEOUS
  Administered 2019-06-20 (×2): 2 [IU] via SUBCUTANEOUS
  Administered 2019-06-21 (×2): 4 [IU] via SUBCUTANEOUS
  Administered 2019-06-21: 2 [IU] via SUBCUTANEOUS

## 2019-06-16 MED ORDER — SODIUM CHLORIDE 0.9% FLUSH
10.0000 mL | Freq: Three times a day (TID) | INTRAVENOUS | Status: DC
  Administered 2019-06-16 – 2019-06-19 (×9): 10 mL
  Administered 2019-06-19: 20 mL
  Administered 2019-06-19: 10 mL

## 2019-06-16 MED ORDER — INSULIN ASPART 100 UNIT/ML ~~LOC~~ SOLN
2.0000 [IU] | SUBCUTANEOUS | Status: DC
  Administered 2019-06-16 (×2): 2 [IU] via SUBCUTANEOUS

## 2019-06-16 NOTE — Progress Notes (Signed)
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  Most of the history was taken from the wife and staff.  Patient was sedated with fentanyl but easily arousable and answers questions by nodding his head.  Patient was hypotensive with blood pressure of 58/47.  As per wife patient has been noncompliant with his CPAP over the last week or so and has been very frustrated with it due to stress is work.  He has not complained of any sputum, hemoptysis or fever.  As per wife patient was running his errands yesterday with no issues.  She states that he was told that he might need some more dilation of his subglottic stenosis.  Patient was a difficult intubation as per report.  Patient did get his 2 shots of vaccine.  Patient is also Covid negative today.  CT chest is showing bilateral pulmonary infiltrates.   Subglottic stenosis- managed at [facility] (Dr. Joya Gaskins; last seen 2019)- circumferential narrowing, inflammation w/o cancer on 2015 bx. Chronic dysphonia. Had stridor on exam at last 2019 ENT office visit. Had planned for SMDL with coblator ablation  in 2020 eval by Dr. Rogers Blocker in rheum due to concern for GPA  Past Medical History  -Hypothyroidism -Obstructive sleep apnea -Subglottic stenosis -Deviated septum  Micro:  5/23 blood cx>> 5/23 urine cx>> NG 5/24 resp  cx> moderate PMN, rare GPR>> rare normal flora 5/23 covid negative  Antibiotics:  Cefepime 5/23 vanc 5/23 ceftriaxone 5/24>> Flagyl 5/24>>  Significant diagnostic tests:  5/23 CT chest: dependent b/l LL infiltrates with air bronchograms, pulmonary edema. No PE.   Interval history  Chest tubes placed yesterday with resolution of pneumothorax. Some pain from tubes treated with opiates. He denies other complaints.  Objective   Blood pressure 124/71, pulse 77, temperature 98.2 F (36.8 C), temperature source Oral, resp. rate (!) 28, height 5\' 8"  (1.727 m), weight 98.2 kg, SpO2 100 %.    Vent Mode: PSV FiO2 (%):  [40 %] 40 % PEEP:  [5 cmH20] 5 cmH20 Pressure Support:  [5 cmH20] 5 cmH20   Intake/Output Summary (Last 24 hours) at 06/16/2019 0958 Last data filed at 06/16/2019 0900 Gross per 24 hour  Intake 1605 ml  Output 695 ml  Net 910 ml   Filed Weights   06/13/19 0841 06/14/19 0421 06/15/19 0500  Weight: 98.7 kg 99.7 kg 98.2 kg    Examination:   General: healthy appearing man sitting up in bed intubated, not sedated, very interactive HENT: [place]/AT, eyes anicteric, oral mucosa moist. ETT in place. Lungs: persistent but less bloody secretions from ETT. Small audible cuff leak but not detectable on MV. Faint rhales in right base. Rhonchi cleared with suctioning. No tidaling or air leak from chest tubes- both flushed. Cardiovascular: RRR, no murmurs Abdomen: obese, soft, NT, ND Extremities: warm, dry, no edema or cyanosis Neuro:  RASS =0, strong cough, no significant gag with ETT. Moving all extremities spontaeously. Writing to communicate. Derm: no rashes or wounds. No erythema around chest tubes.  CXR 5/26 personally reviewed- b/l chest tubes in place, no persistent pneumothorax. [place] emphysema on left  Assessment & Plan:  Bilateral enlarging pneumothoraces post CPR -con't b/l chest tubes to suction -58mm Hg -flush tubes per protocol  Acute hypoxic respiratory failure require  mechanical ventilation B/l LL aspiration pneumonia Chronic progressive subglottic stenosis; prev followed at [facility] but wants to establish with a [facility]. No cuff leak on exam this morning. -Con't ceftriaxone for aspiration pneumonia to complete 7 day course -ENT consulted- discussed case over the phone again today. Trial of steroids 24-48 hours and even consider downsizing ETT to determine if cuff leak. With small audible leak it is an improvement, but need a larger leak detectable from the vent to comfortably extubate. Downsizing not an option with ongoing thick secretions -bougie at bedside in case he has a significant mucus plug causing tube occlusion again -solumedrol Q6h for inflammation to determine if there is a reversible component to airway inflammation -con't vent support; above average risk for failed extubation for acute on chronic airway inflammation -titrate PEEP & FIO2 as able to maintain SpO2 >88% -LTVV; has been doing well on PS+ CPAP  Cardiac arrest- bradycardic, due to mucus plugging of ETT -con't tele monitoring -flush ETT with saline Qshift  Prolonged QTc- improving -repeat EKG -con't PPI, avoid pepcid -change fentantyl to dilaudid -avoid QTc prolonging meds  Acute anemia, possibly dilutional - stable -con't to monitor periodically -transfuse for Hb <7 or hemodynamically significant bleeding  Lactic acidosis likely 2/2 pneumonia, possibly hypoxia- now normalized AG  Best practice: GI prophylaxis: pantoprazole DVT prophylaxis: lovenox fam- wife & son updated at bedside 5/26  Labs   CBC: Recent Labs  Lab 06/13/19 0439 06/13/19 0501 06/13/19 1103 06/14/19 0749 06/14/19 1907 06/14/19 2058 06/15/19 0338  WBC 11.5*  --   --  8.9  --   --  7.8  NEUTROABS  --   --   --  7.0  --   --   --   HGB 18.3*   < > 14.6 12.6* 12.6* 12.6* 12.8*  HCT 56.1*   < > 43.0 37.3* 37.0* 37.0* 38.5*  MCV 96.4  --   --  91.6  --   --  92.3  PLT 302  --   --  186  --    --  171   < > = values in this interval not displayed.    Basic Metabolic Panel: Recent Labs  Lab 06/13/19 0440 06/13/19 0440 06/13/19 0501 06/13/19 0555 06/14/19 0749 06/14/19 0749 06/14/19 1907 06/14/19 2058 06/14/19 2306 06/15/19 0338 06/16/19 0256  NA 137   < > 138   < > 139   < > 139 142 142 139 141  K 4.5   < > 4.5   < > 3.7   < > 3.5 4.0 4.1 3.7 4.0  CL 102   < > 102  --  112*  --   --   --  110 110 108  CO2 23  --   --   --  21*  --   --   --  25 23 24   GLUCOSE 262*   < > 218*  --  108*  --   --   --  111* 120* 187*  BUN 18   < > 26*  --  18  --   --   --  21 23 30*  CREATININE 1.38*   < > 1.40*  --  1.30*  --   --   --  1.27* 1.09 1.09  CALCIUM 9.4  --   --   --  8.1*  --   --   --  8.6* 8.5* 8.7*  MG  --   --   --   --  1.6*  --   --   --   --   --   --    < > = values in this interval not displayed.   GFR: Estimated Creatinine Clearance: 69.6 mL/min (by C-G formula based on SCr of 1.09 mg/dL). Recent Labs  Lab 06/13/19 0439 06/13/19 0638 06/13/19 1146 06/14/19 0749 06/15/19 0338  WBC 11.5*  --   --  8.9 7.8  LATICACIDVEN 5.6* 5.8* 3.6*  --   --     Liver Function Tests: Recent Labs  Lab 06/13/19 0440  AST 29  ALT 24  ALKPHOS 79  BILITOT 1.1  PROT 6.2*  ALBUMIN 3.6   No results for input(s): LIPASE, AMYLASE in the last 168 hours. No results for input(s): AMMONIA in the last 168 hours.  ABG    Component Value Date/Time   PHART 7.410 06/14/2019 2058   PCO2ART 36.1 06/14/2019 2058   PO2ART 114 (H) 06/14/2019 2058   HCO3 22.9 06/14/2019 2058   TCO2 24 06/14/2019 2058   ACIDBASEDEF 1.0 06/14/2019 2058   O2SAT 99.0 06/14/2019 2058     Coagulation Profile: Recent Labs  Lab 06/13/19 0440  INR 1.1    Cardiac Enzymes: No results for input(s): CKTOTAL, CKMB, CKMBINDEX, TROPONINI in the last 168 hours.  HbA1C: No results found for: HGBA1C  CBG: Recent Labs  Lab 06/15/19 1542 06/15/19 1946 06/15/19 2347 06/16/19 0339 06/16/19 0717    GLUCAP 172* 147* 156* 150* 155*     This patient is critically ill with multiple organ system failure which requires frequent high complexity decision making, assessment, support, evaluation, and titration of therapies. This was completed through the application of advanced monitoring technologies and extensive interpretation of multiple databases. During this encounter critical care time was devoted to patient care services described in this note for 47 minutes.    Julian Hy, DO 06/16/19 11:13 AM [facility]

## 2019-06-16 NOTE — Progress Notes (Signed)
eLink Physician-Brief Progress Note Patient Name: Chase Guzman DOB: [birth year] MRN: [id number]   Date of Service  06/16/2019  HPI/Events of Note  Urinary retention  eICU Interventions  In / Out bladder catheterization ordered.        Kerry Kass Arpan Eskelson 06/16/2019, 4:42 AM

## 2019-06-16 NOTE — Progress Notes (Signed)
Pt switched over to PCV with set rate due to pt falling sleep and low Vt.  Pt tolerating well

## 2019-06-17 ENCOUNTER — Inpatient Hospital Stay (HOSPITAL_COMMUNITY): Payer: BLUE CROSS/BLUE SHIELD

## 2019-06-17 DIAGNOSIS — J939 Pneumothorax, unspecified: Secondary | ICD-10-CM | POA: Diagnosis not present

## 2019-06-17 DIAGNOSIS — J386 Stenosis of larynx: Secondary | ICD-10-CM | POA: Diagnosis not present

## 2019-06-17 DIAGNOSIS — Z4682 Encounter for fitting and adjustment of non-vascular catheter: Secondary | ICD-10-CM | POA: Diagnosis not present

## 2019-06-17 DIAGNOSIS — J690 Pneumonitis due to inhalation of food and vomit: Secondary | ICD-10-CM | POA: Diagnosis not present

## 2019-06-17 DIAGNOSIS — J9601 Acute respiratory failure with hypoxia: Secondary | ICD-10-CM | POA: Diagnosis not present

## 2019-06-17 DIAGNOSIS — J9811 Atelectasis: Secondary | ICD-10-CM | POA: Diagnosis not present

## 2019-06-17 DIAGNOSIS — Z9911 Dependence on respirator [ventilator] status: Secondary | ICD-10-CM | POA: Diagnosis not present

## 2019-06-17 LAB — GLUCOSE, CAPILLARY
Glucose-Capillary: 121 mg/dL — ABNORMAL HIGH (ref 70–99)
Glucose-Capillary: 122 mg/dL — ABNORMAL HIGH (ref 70–99)
Glucose-Capillary: 126 mg/dL — ABNORMAL HIGH (ref 70–99)
Glucose-Capillary: 139 mg/dL — ABNORMAL HIGH (ref 70–99)
Glucose-Capillary: 145 mg/dL — ABNORMAL HIGH (ref 70–99)
Glucose-Capillary: 148 mg/dL — ABNORMAL HIGH (ref 70–99)

## 2019-06-17 LAB — BASIC METABOLIC PANEL
Anion gap: 11 (ref 5–15)
BUN: 40 mg/dL — ABNORMAL HIGH (ref 8–23)
CO2: 26 mmol/L (ref 22–32)
Calcium: 9.3 mg/dL (ref 8.9–10.3)
Chloride: 110 mmol/L (ref 98–111)
Creatinine, Ser: 1.06 mg/dL (ref 0.61–1.24)
GFR calc Af Amer: >60 mL/min (ref >60)
GFR calc non Af Amer: >60 mL/min (ref >60)
Glucose, Bld: 141 mg/dL — ABNORMAL HIGH (ref 70–99)
Potassium: 3.9 mmol/L (ref 3.5–5.1)
Sodium: 147 mmol/L — ABNORMAL HIGH (ref 135–145)

## 2019-06-17 MED ORDER — FREE WATER
200.0000 mL | Status: DC
  Administered 2019-06-17 – 2019-06-19 (×8): 200 mL

## 2019-06-17 MED ORDER — DOXAZOSIN MESYLATE 2 MG PO TABS
2.0000 mg | ORAL_TABLET | Freq: Every day | ORAL | Status: DC
  Administered 2019-06-19 – 2019-06-22 (×3): 2 mg
  Filled 2019-06-17 (×6): qty 1

## 2019-06-17 NOTE — Progress Notes (Signed)
Pt resting on CPAP/PS. Low RR and VT alarm ringing despite adjustments. Pt placed on full support to rest. Per Dr. Carlis Abbott okay to place pt on PRVC instead of previous PCV. RT to place pt back on CPAP/PS while awake. RT will continue to monitor.

## 2019-06-17 NOTE — Progress Notes (Signed)
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  Most of the history was taken from the wife and staff.  Patient was sedated with fentanyl but easily arousable and answers questions by nodding his head.  Patient was hypotensive with blood pressure of 58/47.  As per wife patient has been noncompliant with his CPAP over the last week or so and has been very frustrated with it due to stress is work.  He has not complained of any sputum, hemoptysis or fever.  As per wife patient was running his errands yesterday with no issues.  She states that he was told that he might need some more dilation of his subglottic stenosis.  Patient was a difficult intubation as per report.  Patient did get his 2 shots of vaccine.  Patient is also Covid negative today.  CT chest is showing bilateral pulmonary infiltrates.   Subglottic stenosis- managed at [facility] ENT (Dr. Joya Gaskins; last seen 2019)- circumferential narrowing, inflammation w/o cancer on 2015 bx. Chronic dysphonia. Had stridor on exam at last 2019 ENT office visit. Had planned for SMDL with coblator ablation  in 2020 eval by Dr. Rogers Blocker in rheum due to concern for GPA  Past Medical History  -Hypothyroidism -Obstructive sleep apnea -Subglottic stenosis -Deviated septum  Micro:  5/23 blood cx>> 5/23 urine cx>> NG 5/24 resp  cx> moderate PMN, rare GPR>> normal flora 5/23 covid negative  Antibiotics:  Cefepime 5/23 vanc 5/23 ceftriaxone 5/23>> Flagyl 5/23  Significant diagnostic tests:  5/23 CT chest: dependent b/l LL infiltrates with air bronchograms, pulmonary edema. No PE.    Interval history  Has been getting up to the toilet to use the bathroom. Denies complaints. Less sputum today.  Objective   Blood pressure 127/79, pulse 67, temperature 98.3 F (36.8 C), temperature source Oral, resp. rate (!) 21, height 5\' 8"  (1.727 m), weight 102 kg, SpO2 100 %.    Vent Mode: CPAP;PSV FiO2 (%):  [40 %] 40 % Set Rate:  [18 bmp] 18 bmp PEEP:  [5 cmH20] 5 cmH20 Pressure Support:  [5 cmH20-10 cmH20] 5 cmH20   Intake/Output Summary (Last 24 hours) at 06/17/2019 0941 Last data filed at 06/17/2019 0900 Gross per 24 hour  Intake 1455 ml  Output 1460 ml  Net -5 ml   Filed Weights   06/14/19 0421 06/15/19 0500 06/17/19 0500  Weight: 99.7 kg 98.2 kg 102 kg    Examination:     General: healthy appearing man sitting up in bed-chair position.  HENT: [place]/AT, eyes anicteric, oral mucosa moist Lungs: faint rhonchi, breathing comfortably on 5/5. Audible cuff leak but <~100cc leak on vent, not significantly improved since yesterday. Cardiovascular: RRR, no murmurs.  Abdomen: soft, NT, ND Extremities: warm, dry, no edema or cyanosis Neuro: RASS =0, strong cough, no significant gag with ETT. Moving all extremities spontaeously. Writing, interactive.  Derm: no rashes or wounds, no erythema around chest tubes  CXR 5/27 personally reviewed- pneumothoraxes resolved, chest tubes remain in place. R chest tube appears slightly retracted.  Assessment & Plan:  Bilateral enlarging pneumothoraces post- CPR -con't b/l chest tubes to suction -79mm Hg -flush tubes per protocol  Acute hypoxic respiratory failure require mechanical ventilation B/l LL aspiration pneumonia History of OSA on CPAP Chronic progressive subglottic  stenosis; prev followed at [facility] but wants to establish with a [facility]. No cuff leak on exam this morning. -Con't ceftriaxone for aspiration pneumonia to complete 7 day course -ENT consulted-  Discussed case today with Dr. Wilburn Cornelia. Still concerning to have only a minor cuff leak. He will assess the patient at bedside today. May require extubation in the OR. -bougie at bedside in case he has a significant mucus plug causing tube occlusion again. Has some evidence of plugging of distal tube, which has been flushed and may have to be electively changed vs trial of extubation. -solumedrol Q6h for inflammation to determine if there is a reversible component to airway inflammation -con't vent support; above average risk for failed extubation for acute on chronic airway inflammation -titrate PEEP & FIO2 as able to maintain SpO2 >90% -LTVV; has been doing well on PS+ CPAP during the day  Cardiac arrest- bradycardic, due to mucus plugging of ETT -con't tele monitoring -flush ETT with saline Qshift  Prolonged QTc- resolved -con't to monitor on tele -con't PPI, avoid pepcid -dilaudid rather than fentanyl PRN -avoid QTc prolonging meds  Acute anemia, possibly dilutional - stable -con't to monitor periodically -transfuse for Hb <7 or hemodynamically significant bleeding  Lactic acidosis likely 2/2 pneumonia, possibly hypoxia- now normalized AG  Best practice: GI prophylaxis: pantoprazole DVT prophylaxis: lovenox fam- wife & son updated at bedside 5/27  Labs   CBC: Recent Labs  Lab 06/13/19 0439 06/13/19 0501 06/13/19 1103 06/14/19 0749 06/14/19 1907 06/14/19 2058 06/15/19 0338  WBC 11.5*  --   --  8.9  --   --  7.8  NEUTROABS  --   --   --  7.0  --   --   --   HGB 18.3*   < > 14.6 12.6* 12.6* 12.6* 12.8*  HCT 56.1*   < > 43.0 37.3* 37.0* 37.0* 38.5*  MCV 96.4  --   --  91.6  --   --  92.3  PLT 302  --   --  186  --   --  171   < > = values in this interval not displayed.      Basic Metabolic Panel: Recent Labs  Lab 06/14/19 0749 06/14/19 1907 06/14/19 2058 06/14/19 2306 06/15/19 0338 06/16/19 0256 06/17/19 0500  NA 139   < > 142 142 139 141 147*  K 3.7   < > 4.0 4.1 3.7 4.0 3.9  CL 112*  --   --  110 110 108 110  CO2 21*  --   --  25 23 24 26   GLUCOSE 108*  --   --  111* 120* 187* 141*  BUN 18  --   --  21 23 30* 40*  CREATININE 1.30*  --   --  1.27* 1.09 1.09 1.06  CALCIUM 8.1*  --   --  8.6* 8.5* 8.7* 9.3  MG 1.6*  --   --   --   --   --   --    < > = values in this interval not displayed.  GFR: Estimated Creatinine Clearance: 72.9 mL/min (by C-G formula based on SCr of 1.06 mg/dL). Recent Labs  Lab 06/13/19 0439 06/13/19 0638 06/13/19 1146 06/14/19 0749 06/15/19 0338  WBC 11.5*  --   --  8.9 7.8  LATICACIDVEN 5.6* 5.8* 3.6*  --   --     Liver Function Tests: Recent Labs  Lab 06/13/19 0440  AST 29  ALT 24  ALKPHOS 79  BILITOT 1.1  PROT 6.2*  ALBUMIN 3.6   No results for input(s): LIPASE, AMYLASE in the last 168 hours. No results for input(s): AMMONIA in the last 168 hours.  ABG    Component Value Date/Time   PHART 7.410 06/14/2019 2058   PCO2ART 36.1 06/14/2019 2058   PO2ART 114 (H) 06/14/2019 2058   HCO3 22.9 06/14/2019 2058   TCO2 24 06/14/2019 2058   ACIDBASEDEF 1.0 06/14/2019 2058   O2SAT 99.0 06/14/2019 2058     Coagulation Profile: Recent Labs  Lab 06/13/19 0440  INR 1.1    Cardiac Enzymes: No results for input(s): CKTOTAL, CKMB, CKMBINDEX, TROPONINI in the last 168 hours.  HbA1C: No results found for: HGBA1C  CBG: Recent Labs  Lab 06/16/19 1528 06/16/19 1932 06/16/19 2321 06/17/19 0316 06/17/19 0741  GLUCAP 134* 111* 186* 148* 121*     This patient is critically ill with multiple organ system failure which requires frequent high complexity decision making, assessment, support, evaluation, and titration of therapies. This was completed through the application of advanced monitoring  technologies and extensive interpretation of multiple databases. During this encounter critical care time was devoted to patient care services described in this note for 53 minutes.    Julian Hy, DO 06/17/19 3:33 PM [place]

## 2019-06-17 NOTE — Plan of Care (Signed)
  Problem: Activity: Goal: Risk for activity intolerance will decrease Outcome: Progressing Note: Pt able to stand at bedside on ventilator and urinate. No complications with mobility    Problem: Nutrition: Goal: Adequate nutrition will be maintained Outcome: Progressing Note: Pt is tolerating tube feed well.    Problem: Elimination: Goal: Will not experience complications related to bowel motility Outcome: Progressing Goal: Will not experience complications related to urinary retention Outcome: Progressing

## 2019-06-17 NOTE — Progress Notes (Signed)
OG was out of position after getting patient back to bed. Tube feeds stopped and Dr. Carlis Abbott made aware. Per MD, do not replace OG until ENT MD has rounded on patient this evening.

## 2019-06-17 NOTE — Progress Notes (Signed)
Nutrition Follow-up  DOCUMENTATION CODES:   Obesity unspecified  INTERVENTION:   Continue tube feeding via OG tube: Vital High Protein at 55 ml/h (1320 ml per day) Pro-stat 30 ml BID  Provides 1520 kcal, 146 gm protein, 1104 ml free water daily  NUTRITION DIAGNOSIS:   Inadequate oral intake related to inability to eat as evidenced by NPO status.  Ongoing   GOAL:   Provide needs based on ASPEN/SCCM guidelines  Met with TF  MONITOR:   Vent status, TF tolerance, Labs  REASON FOR ASSESSMENT:   Ventilator, Consult Enteral/tube feeding initiation and management  ASSESSMENT:   74 yo male admitted with SOB. Intubated on admission. PMH includes subglottic stenosis, OSA, hypothyroidism.   Discussed patient in ICU rounds and with RN today. Pneumothoraces have resolved, chest tubes in place. Output 110 ml x 24 hours. OG tube in place. Currently receiving Vital High Protein at 55 ml/h (1320 ml/day) with Pro-stat 30 ml BID via OG tube. Tolerating TF well to meet nutrition needs.  Free water flushes 200 ml every 4 hours.  Patient remains intubated on ventilator support. Minor cuff leak. ENT MD to evaluate for possible extubation in the OR.  MV: 11.8 L/min Temp (24hrs), Avg:98.1 F (36.7 C), Min:97.6 F (36.4 C), Max:98.4 F (36.9 C)   Labs reviewed. Na 147 (H) CBG: [phone]  Medications reviewed and include novolog, solu-medrol.   Currently 102 kg, up from 98.7 kg on admission. I/O +3.2 L  Diet Order:   Diet Order            Diet NPO time specified  Diet effective now              EDUCATION NEEDS:   Not appropriate for education at this time  Skin:  Skin Assessment: Reviewed RN Assessment  Last BM:  5/27 type 4  Height:   Ht Readings from Last 1 Encounters:  06/13/19 _0  (1.727 m)    Weight:   Wt Readings from Last 1 Encounters:  06/17/19 102 kg   Admission weight 98.7 kg (BMI=33.1)  Ideal Body Weight:  70 kg  BMI:  Body mass index  is 34.19 kg/m.  Estimated Nutritional Needs:   Kcal:  1325-1550  Protein:  140 gm  Fluid:  >/= 2 L    Lucas Mallow, RD, LDN, CNSC Please refer to Amion for contact information.

## 2019-06-18 ENCOUNTER — Inpatient Hospital Stay (HOSPITAL_COMMUNITY): Payer: BLUE CROSS/BLUE SHIELD

## 2019-06-18 DIAGNOSIS — D649 Anemia, unspecified: Secondary | ICD-10-CM | POA: Diagnosis not present

## 2019-06-18 DIAGNOSIS — J386 Stenosis of larynx: Secondary | ICD-10-CM | POA: Diagnosis not present

## 2019-06-18 DIAGNOSIS — S270XXD Traumatic pneumothorax, subsequent encounter: Secondary | ICD-10-CM | POA: Diagnosis not present

## 2019-06-18 DIAGNOSIS — J9601 Acute respiratory failure with hypoxia: Secondary | ICD-10-CM | POA: Diagnosis not present

## 2019-06-18 DIAGNOSIS — Z9911 Dependence on respirator [ventilator] status: Secondary | ICD-10-CM | POA: Diagnosis not present

## 2019-06-18 DIAGNOSIS — Z4682 Encounter for fitting and adjustment of non-vascular catheter: Secondary | ICD-10-CM | POA: Diagnosis not present

## 2019-06-18 LAB — BASIC METABOLIC PANEL
Anion gap: 7 (ref 5–15)
BUN: 39 mg/dL — ABNORMAL HIGH (ref 8–23)
CO2: 27 mmol/L (ref 22–32)
Calcium: 9.2 mg/dL (ref 8.9–10.3)
Chloride: 112 mmol/L — ABNORMAL HIGH (ref 98–111)
Creatinine, Ser: 0.95 mg/dL (ref 0.61–1.24)
GFR calc Af Amer: >60 mL/min (ref >60)
GFR calc non Af Amer: >60 mL/min (ref >60)
Glucose, Bld: 137 mg/dL — ABNORMAL HIGH (ref 70–99)
Potassium: 3.7 mmol/L (ref 3.5–5.1)
Sodium: 146 mmol/L — ABNORMAL HIGH (ref 135–145)

## 2019-06-18 LAB — CBC
HCT: 37.1 % — ABNORMAL LOW (ref 39.0–52.0)
Hemoglobin: 12.4 g/dL — ABNORMAL LOW (ref 13.0–17.0)
MCH: 30.8 pg (ref 26.0–34.0)
MCHC: 33.4 g/dL (ref 30.0–36.0)
MCV: 92.3 fL (ref 80.0–100.0)
Platelets: 189 10*3/uL (ref 150–400)
RBC: 4.02 MIL/uL — ABNORMAL LOW (ref 4.22–5.81)
RDW: 13.4 % (ref 11.5–15.5)
WBC: 7 10*3/uL (ref 4.0–10.5)
nRBC: 0 % (ref 0.0–0.2)

## 2019-06-18 LAB — CULTURE, BLOOD (ROUTINE X 2)
Culture: NO GROWTH
Culture: NO GROWTH

## 2019-06-18 LAB — GLUCOSE, CAPILLARY
Glucose-Capillary: 121 mg/dL — ABNORMAL HIGH (ref 70–99)
Glucose-Capillary: 105 mg/dL — ABNORMAL HIGH (ref 70–99)
Glucose-Capillary: 120 mg/dL — ABNORMAL HIGH (ref 70–99)
Glucose-Capillary: 103 mg/dL — ABNORMAL HIGH (ref 70–99)
Glucose-Capillary: 138 mg/dL — ABNORMAL HIGH (ref 70–99)
Glucose-Capillary: 148 mg/dL — ABNORMAL HIGH (ref 70–99)

## 2019-06-18 MED ORDER — MELATONIN 3 MG PO TABS: 3.0000 mg | ORAL_TABLET | Freq: Every evening | ORAL | Status: DC | PRN

## 2019-06-18 MED ORDER — MELATONIN 3 MG PO TABS
3.0000 mg | ORAL_TABLET | Freq: Every evening | ORAL | Status: DC | PRN
  Administered 2019-06-18: 3 mg via ORAL
  Filled 2019-06-18: qty 1

## 2019-06-18 NOTE — Progress Notes (Signed)
eLink Physician-Brief Progress Note Patient Name: Chase Guzman DOB: 03-May-1945 MRN: [id number]   Date of Service  06/18/2019  HPI/Events of Note  Pt needs a.m. BMP.  eICU Interventions   a.m. BMP ordered.        [place] U Ogan 06/18/2019, 3:00 AM

## 2019-06-18 NOTE — Progress Notes (Addendum)
ENT Progress Note: Patient's history and recent events of hospitalization reviewed.   Subjective: Patient awake and alert, intubated and comfortable  Objective: Vital signs in last 24 hours: Temp:  [98.2 F (36.8 C)-99.2 F (37.3 C)] 98.2 F (36.8 C) (05/28 1500) Pulse Rate:  [45-80] 69 (05/28 1700) Resp:  [11-28] 26 (05/28 1700) BP: (115-157)/(74-93) 144/91 (05/28 1700) SpO2:  [94 %-100 %] 96 % (05/28 1700) FiO2 (%):  [30 %-40 %] 30 % (05/28 1600) Weight:  [99.1 kg] 99.1 kg (05/28 0406) Weight change: -2.9 kg Last BM Date: 06/17/19  Intake/Output from previous day: 05/27 0701 - 05/28 0700 In: 990.1 [I.V.:30; NG/GT:850; IV Piggyback:100.1] Out: 958 [Urine:950; Chest Tube:8] Intake/Output this shift: Total I/O In: 178 [NG/GT:178] Out: 75 [Urine:75]  Labs: Recent Labs    06/18/19 0248  WBC 7.0  HGB 12.4*  HCT 37.1*  PLT 189   Recent Labs    06/17/19 0500 06/18/19 0248  NA 147* 146*  K 3.9 3.7  CL 110 112*  CO2 26 27  GLUCOSE 141* 137*  BUN 40* 39*  CALCIUM 9.3 9.2    Studies/Results: DG CHEST PORT 1 VIEW  Result Date: 06/17/2019 CLINICAL DATA:  Follow-up bilateral pneumothoraces. Endotracheally intubated. EXAM: PORTABLE CHEST 1 VIEW COMPARISON:  06/16/2019 FINDINGS: Endotracheal tube and nasogastric tube remain in expected position. Bilateral pleural pigtail catheters remain in place, and no pneumothorax visualized. Cardiomegaly and low lung volumes are again seen. Mild bibasilar atelectasis is seen, however there is no evidence of pulmonary consolidation or pleural effusion. IMPRESSION: Mild bibasilar atelectasis. No pneumothorax visualized. Electronically Signed   By: Marlaine Hind M.D.   On: 06/17/2019 09:53   DG Abd Portable 1V  Result Date: 06/18/2019 CLINICAL DATA:  NG tube placement. EXAM: PORTABLE ABDOMEN - 1 VIEW COMPARISON:  06/14/2019 FINDINGS: The NG tube tip is in the body/antral region of the stomach. Right-sided chest tube is noted. The lung  bases are clear. Mild gaseous distention of the colon noted. No obvious free air. IMPRESSION: NG tube tip in the body/antral region of the stomach. Electronically Signed   By: Marijo Sanes M.D.   On: 06/18/2019 15:02        Jerrell Belfast 06/18/2019, 5:46 PM  PHYSICAL EXAM: Oral tracheal tube in place, patient stable on current ventilatory support.   Assessment/Plan: Long bedside discussion with the patient, his wife and nursing caregivers.  Reviewed his history and events leading to his current hospitalization.  By their report, the patient has had progressive symptoms of shortness of breath and some difficulty with exercise and activities.  He wears nightly CPAP and has significant reflux.  He has well-documented subglottic stenosis, last evaluated by Dr. Joya Gaskins at [facility] in 2019.  Dr. Joya Gaskins recommended outpatient treatment for subglottic stenosis, patient deferred but has noted progression in symptoms.  His current hospitalization may have resulted from acute reflux and aspiration.  He is clinically improving on his current medical therapy and ventilator settings and is comfortable.  Outlined various treatment options with the patient and his wife including: Trial of extubation and monitoring in the intensive care unit versus possible elective tracheostomy for secure airway.  Optimal treatment would include extubation with a stable airway, monitoring and completing his current treatment with follow-up as an outpatient and then outpatient surgical management of his subglottic stenosis.  If the patient fails to tolerate extubation over the short-term would recommend reintubation and elective tracheostomy.  With current improving pulmonary status and cuff leak it  may be prudent to attempt extubation over the weekend, certainly by Monday in order to better assess airway needs going forward.  ENT available on call this weekend, Dr. Redmond Baseman if there are any acute needs.  We will plan  follow-up as an outpatient with Dr. Redmond Baseman, if patient tolerates extubation, for long-term management of subglottic stenosis.

## 2019-06-18 NOTE — Progress Notes (Signed)
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  Most of the history was taken from the wife and staff.  Patient was sedated with fentanyl but easily arousable and answers questions by nodding his head.  Patient was hypotensive with blood pressure of 58/47.  As per wife patient has been noncompliant with his CPAP over the last week or so and has been very frustrated with it due to stress is work.  He has not complained of any sputum, hemoptysis or fever.  As per wife patient was running his errands yesterday with no issues.  She states that he was told that he might need some more dilation of his subglottic stenosis.  Patient was a difficult intubation as per report.  Patient did get his 2 shots of vaccine.  Patient is also Covid negative today.  CT chest is showing bilateral pulmonary infiltrates.   Subglottic stenosis- managed at [facility] ENT (Dr. Joya Gaskins; last seen 2019)- circumferential narrowing, inflammation w/o cancer on 2015 bx. Chronic dysphonia. Had stridor on exam at last 2019 ENT office visit. Had planned for SMDL with coblator ablation  in 2020 eval by Dr. Rogers Blocker in rheum due to concern for GPA  Past Medical History  -Hypothyroidism -Obstructive sleep apnea -Subglottic stenosis -Deviated septum  Micro:  5/23 blood cx>> 5/23 urine cx>> NG 5/24 resp  cx> moderate PMN, rare GPR>> normal flora 5/23 covid negative  Antibiotics:  Cefepime 5/23 vanc 5/23 ceftriaxone 5/23>> Flagyl 5/23  Significant diagnostic tests:  5/23 CT chest: dependent b/l LL infiltrates with air bronchograms, pulmonary edema. No PE.    Interval history  Hungry and thirsty today. Denies pain.  Objective   Blood pressure (!) 147/91, pulse (!) 57, temperature 98.2 F (36.8 C), temperature source Oral, resp. rate 14, height 5\' 8"  (1.727 m), weight 99.1 kg, SpO2 100 %.    Vent Mode: PSV;CPAP FiO2 (%):  [40 %] 40 % Set Rate:  [18 bmp] 18 bmp Vt Set:  [540 mL] 540 mL PEEP:  [5 cmH20] 5 cmH20 Pressure Support:  [5 cmH20-10 cmH20] 8 cmH20   Intake/Output Summary (Last 24 hours) at 06/18/2019 1034 Last data filed at 06/18/2019 0900 Gross per 24 hour  Intake 715.06 ml  Output 683 ml  Net 32.06 ml   Filed Weights   06/15/19 0500 06/17/19 0500 06/18/19 0406  Weight: 98.2 kg 102 kg 99.1 kg    Examination:       General: healthy appearing man sitting up in bed in NAD  HENT: [place]/AT, eyes anicteric, ETT in place. Lungs: breathing comfortably on PS+ CPAP. Minimal audible cuff leak, not detected by vent. Cardiovascular: RRR, no murmurs  Abdomen: soft, NT, ND Extremities: no clubbing cyanosis or edema Neuro:RASS = 0, moving all extremities. Writing to communicate. Derm: No rashes or wounds.   Resolved problems:  PEA cardiac arrest  due to mucus plugging Lactic acidosis Prolonged QTc  Assessment & Plan:  Bilateral enlarging pneumothoraces post- CPR -con't b/l chest tubes to suction -57mm Hg; ok for water seal for transport and mobility -flush tubes per protocol -AM CXR  Acute hypoxic respiratory failure require mechanical ventilation Chronic progressive subglottic stenosis; prev followed at [facility] but wants to establish with a [facility] ENT.  B/l LL aspiration pneumonia- completing treatment today History of OSA on CPAP -Con't ceftriaxone for aspiration  pneumonia to complete 7 day course of antibiotics (last day today) -ENT consulted-  Discussed case again today with Dr. Wilburn Cornelia. Dr. Redmond Baseman covering this weekend. We both remain concerned to have only a minor cuff leak. Family updated at bedside today by Dr. Wilburn Cornelia.  -bougie at bedside in case he has a significant mucus plug causing tube occlusion again. -solumedrol Q6h for inflammation to determine if there is a reversible component to airway inflammation -con't vent support; above average risk for failed extubation for acute on chronic airway inflammation -titrate PEEP & FiO2 as able to maintain SpO2 >90% -LTVV; has been doing well on PS+ CPAP during the day. Needs more vent support at night -if extubated, needs CPAP QHS for OSA -flush ETT Qshift with saline  Prolonged QTc- resolved -con't to monitor on tele -con't PPI, avoiding H2-blockers -dilaudid rather than fentanyl PRN -avoid QTc prolonging meds  Acute anemia, possibly dilutional - stable -con't to monitor periodically -transfuse for Hb <7 or hemodynamically significant bleeding    Best practice: GI prophylaxis: pantoprazole DVT prophylaxis: lovenox family- wife & son updated at bedside 5/28    This patient is critically ill with multiple organ system failure which requires frequent high complexity decision making, assessment, support, evaluation, and titration of therapies. This was completed through the application of advanced monitoring technologies and extensive interpretation of multiple databases. During this encounter critical care time was devoted to patient care services and family updates described in this note for 55 minutes.     Julian Hy, DO 06/18/19 2:03 PM [place] [facility]

## 2019-06-18 NOTE — Progress Notes (Signed)
eLink Physician-Brief Progress Note Patient Name: Chase Guzman DOB: [birth year] MRN: [id number]   Date of Service  06/18/2019  HPI/Events of Note  Pt needs a PRN sleep aid.  eICU Interventions  Melatonin 3 mg via NG tube Q HS PRN insomnia ordered.        Kerry Kass Annamaria Salah 06/18/2019, 8:14 PM

## 2019-06-19 ENCOUNTER — Inpatient Hospital Stay (HOSPITAL_COMMUNITY): Payer: BLUE CROSS/BLUE SHIELD

## 2019-06-19 DIAGNOSIS — J9601 Acute respiratory failure with hypoxia: Secondary | ICD-10-CM | POA: Diagnosis not present

## 2019-06-19 DIAGNOSIS — J939 Pneumothorax, unspecified: Secondary | ICD-10-CM | POA: Diagnosis not present

## 2019-06-19 DIAGNOSIS — J9811 Atelectasis: Secondary | ICD-10-CM | POA: Diagnosis not present

## 2019-06-19 LAB — GLUCOSE, CAPILLARY
Glucose-Capillary: 106 mg/dL — ABNORMAL HIGH (ref 70–99)
Glucose-Capillary: 122 mg/dL — ABNORMAL HIGH (ref 70–99)
Glucose-Capillary: 125 mg/dL — ABNORMAL HIGH (ref 70–99)
Glucose-Capillary: 127 mg/dL — ABNORMAL HIGH (ref 70–99)
Glucose-Capillary: 133 mg/dL — ABNORMAL HIGH (ref 70–99)
Glucose-Capillary: 141 mg/dL — ABNORMAL HIGH (ref 70–99)

## 2019-06-19 MED ORDER — VECURONIUM BROMIDE 10 MG IV SOLR
INTRAVENOUS | Status: AC
  Filled 2019-06-19: qty 10

## 2019-06-19 MED ORDER — OXYCODONE HCL 5 MG PO TABS: 5.0000 mg | ORAL_TABLET | Freq: Four times a day (QID) | ORAL | Status: DC | PRN

## 2019-06-19 MED ORDER — ROCURONIUM BROMIDE 10 MG/ML (PF) SYRINGE
PREFILLED_SYRINGE | INTRAVENOUS | Status: AC
  Filled 2019-06-19: qty 10

## 2019-06-19 MED ORDER — MIDAZOLAM HCL 2 MG/2ML IJ SOLN
INTRAMUSCULAR | Status: AC
  Filled 2019-06-19: qty 4

## 2019-06-19 MED ORDER — WHITE PETROLATUM EX OINT
TOPICAL_OINTMENT | CUTANEOUS | Status: AC
  Administered 2019-06-19: 0.2
  Filled 2019-06-19: qty 28.35

## 2019-06-19 MED ORDER — SUCCINYLCHOLINE CHLORIDE 200 MG/10ML IV SOSY
PREFILLED_SYRINGE | INTRAVENOUS | Status: AC
  Filled 2019-06-19: qty 10

## 2019-06-19 MED ORDER — ORAL CARE MOUTH RINSE
15.0000 mL | Freq: Two times a day (BID) | OROMUCOSAL | Status: DC
  Administered 2019-06-19 – 2019-06-20 (×2): 15 mL via OROMUCOSAL

## 2019-06-19 MED ORDER — FENTANYL CITRATE (PF) 100 MCG/2ML IJ SOLN
INTRAMUSCULAR | Status: AC
  Filled 2019-06-19: qty 2

## 2019-06-19 MED ORDER — DOCUSATE SODIUM 50 MG/5ML PO LIQD
100.0000 mg | Freq: Two times a day (BID) | ORAL | Status: DC | PRN
  Administered 2019-06-19: 100 mg
  Filled 2019-06-19: qty 10

## 2019-06-19 MED ORDER — ETOMIDATE 2 MG/ML IV SOLN
INTRAVENOUS | Status: AC
  Filled 2019-06-19: qty 20

## 2019-06-19 NOTE — Progress Notes (Addendum)
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  Admitted with acute respiratory failure due to subglottic stenosis, bilateral aspiration pneumonia, intubated in the ED.  CT chest is showing bilateral pulmonary infiltrates.   Past Medical History  Hypothyroidism Obstructive sleep apnea Subglottic stenosis Deviated septum   Subglottic stenosis- managed at [facility] (Dr. Joya Gaskins; last seen 2019)- circumferential narrowing, inflammation w/o cancer on 2015 bx. Chronic dysphonia. Had stridor on exam at last 2019 ENT office visit.  Had planned for SMDL with coblator ablation  in 2020 eval by Dr. Rogers Blocker in rheum due to concern for GPA Wife states that that he was told that he might need some more dilation of his subglottic stenosis.    Significant Hospital Events   5/23 Admit 5/24 Bradycardic arrest due to mucus plug from ETT and had to be re intubated. CPR for few minutes with good neuro function 5/25 Bilateral chest tubes for pneumothorax  Micro:  5/23 blood cx>> 5/23 urine cx>> NG 5/24 resp cx> moderate PMN, rare GPR>> normal flora 5/23 covid negative  Antibiotics:  Cefepime 5/23 vanc 5/23 ceftriaxone 5/23>> Flagyl 5/23  Significant diagnostic tests:  5/23 CT chest: dependent b/l LL infiltrates with air bronchograms, pulmonary edema. No PE.    Interval history   No acute events. On PSV weans  Objective   Blood pressure 110/69, pulse (!) 51, temperature 98.6 F (37 C), temperature source Oral, resp. rate 19, height 5\' 8"  (1.727 m), weight 98.6 kg, SpO2 100 %.    Vent Mode: PSV;CPAP FiO2 (%):  [30 %] 30 % Set Rate:  [18 bmp] 18 bmp Vt Set:  [540 mL] 540 mL PEEP:  [5 cmH20] 5 cmH20 Pressure Support:  [8 cmH20] 8  cmH20 Plateau Pressure:  [14 cmH20-24 cmH20] 24 cmH20   Intake/Output Summary (Last 24 hours) at 06/19/2019 1042 Last data filed at 06/19/2019 1000 Gross per 24 hour  Intake 1968.06 ml  Output 639 ml  Net 1329.06 ml   Filed Weights   06/17/19 0500 06/18/19 0406 06/19/19 0152  Weight: 102 kg 99.1 kg 98.6 kg    Examination:       Blood pressure 110/69, pulse (!) 51, temperature 98.6 F (37 C), temperature source Oral, resp. rate 19, height 5\' 8"  (1.727 m), weight 98.6 kg, SpO2 100 %. Gen:      No acute distress HEENT:  EOMI, sclera anicteric Neck:     No masses; no thyromegaly, ETT Lungs:    Clear to auscultation bilaterally; normal respiratory effort CV:         Regular rate and rhythm; no murmurs Abd:      + bowel sounds; soft, non-tender; no palpable masses, no distension Ext:    No edema; adequate peripheral perfusion Skin:      Warm and dry; no rash Neuro: Awake, writing on the note pad   Resolved problems:  PEA cardiac arrest due to mucus plugging Lactic acidosis Prolonged QTc  Assessment & Plan:  Acute hypoxic respiratory failure secondary to aspiration pneumonia Chronic progressive subglottic stenosis.  prev followed at [facility] but wants to establish with a [facility].   Continue ceftriaxone to complete 7 days Pressure support weans as tolerated We will discuss with ENT and come up  with a plan of extubation If no cuff leak then he may need to be extuabated in OR Continue Solu-Medrol, monitor for cuff leak CPAP at night for OSA after extubation.  Bilateral pneumothoraxes after CPR Continue chest tube to suction Follow chest x-rays  Prolonged QTc- resolved Continue telemetry monitoring -con't PPI, avoiding H2-blockers -dilaudid rather than fentanyl PRN -avoid QTc prolonging meds  Acute anemia, possibly dilutional - stable Follow CBC  Best practice:  Diet: Tube feeds Pain/Anxiety/Delirium protocol (if indicated): Dilaudid, oxycodone VAP protocol (if  indicated): Ordered DVT prophylaxis: Lovenox GI prophylaxis: Protonix Glucose control: Monitor Mobility: Bed Code Status: Full code Family Communication: Wife updated at bedside Disposition: ICU  Critical care time:    The patient is critically ill with multiple organ system failure and requires high complexity decision making for assessment and support, frequent evaluation and titration of therapies, advanced monitoring, review of radiographic studies and interpretation of complex data.   Critical Care Time devoted to patient care services, exclusive of separately billable procedures, described in this note is 35 minutes.   Marshell Garfinkel MD [place] [facility] Please see [email or web address] for pager details.  06/19/2019, 11:01 AM

## 2019-06-19 NOTE — Procedures (Signed)
Extubation Procedure Note  Patient Details:   Name: Chase Guzman DOB: [birth year] MRN: [id number]   Airway Documentation:    Vent end date: 06/19/19 Vent end time: 1625   Evaluation  O2 sats: stable throughout Complications: No apparent complications Patient did tolerate procedure well. Bilateral Breath Sounds: Clear, Diminished   Yes   Patient extubated per MD order. Positive cuff leak x3. No stridor noted. MD and RN at bedside. Patient able to cough up secretions and talk. Vitals are stable on 4L [place].  Herbie Saxon Delman Goshorn 06/19/2019, 4:32 PM

## 2019-06-19 NOTE — Progress Notes (Addendum)
PCCM progress note  Reviewed ENT notes to date and discussed with Dr. Redmond Baseman, ENT over telephone. He has not been scheduled for OR extubation on Monday.  There is a good cuff leak today.  Dr. Redmond Baseman recommended a trial of extubation in the ICU and then if he fails to OR Noted that his reintubation on Monday 5/24 was uneventful. Plan discussed in detail with patient and wife  The patient is critically ill with multiple organ system failure and requires high complexity decision making for assessment and support, frequent evaluation and titration of therapies, advanced monitoring, review of radiographic studies and interpretation of complex data.   Critical Care Time devoted to patient care services, exclusive of separately billable procedures, described in this note is 45 minutes.   Chase Garfinkel MD [place] [facility] Please see [email or web address] for pager details.  06/19/2019, 3:44 PM

## 2019-06-19 NOTE — Evaluation (Addendum)
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  Admitted 06/13/19 with acute respiratory failure due to subglottic stenosis, bilateral aspiration pneumonia, intubated in the ED. 5/24 bradycardic arrest due to mucous plug; 5/25 bil chest tubes for pheumothoraes after chest compressions. ?to OR for extubation   Clinical Impression   Pt admitted with above diagnosis. Patient has been hospitalized x 6 days on ventilator and currently with limited mobility due to multiple lines/tubes (chest tubes to suction). Able to participate in standing exercises and balance exercises with stable vital signs. Can benefit from exercise program for supine and sitting until he can be safely extubated. Post-extubation will continue to follow for mobility needs.  Pt currently with functional limitations due to the deficits listed below (see PT Problem List). Pt will benefit from skilled PT to increase their independence and safety with mobility to allow discharge to the venue listed below.       Follow Up Recommendations No PT follow up    Equipment Recommendations  None recommended by PT    Recommendations for Other Services       Precautions / Restrictions Precautions Precautions: Other (comment) Precaution Comments: bil chest tubes, ventilator, tube fees      Mobility  Bed Mobility               General bed mobility comments: up in recliner on arrival  Transfers Overall transfer level: Needs assistance Equipment used: None Transfers: Sit to/from Stand Sit to Stand: Supervision         General transfer comment: supervision for multiple lines  Ambulation/Gait Ambulation/Gait assistance: Min guard   Assistive device: 1 person hand held assist       General Gait  Details: marching in place  Stairs            Wheelchair Mobility    Modified Rankin (Stroke Patients Only)       Balance Overall balance assessment: Independent                   Tandem Stance - Right Leg: 30(near tandem) Tandem Stance - Left Leg: 30(near tandem) [place]berg - Eyes Opened: 30 Rhomberg - Eyes Closed: 10                 Pertinent Vitals/Pain Pain Assessment: No/denies pain    Home Living Family/patient expects to be discharged to:: Private residence Living Arrangements: Spouse/significant other Available Help at Discharge: Family Type of Home: House                Prior Function Level of Independence: Independent         Comments: pt/wife denied balance issues PTA; states did not exercise regularly     Hand Dominance   Dominant Hand: Right    Extremity/Trunk Assessment   Upper Extremity Assessment Upper Extremity Assessment: Overall WFL for tasks assessed    Lower Extremity Assessment Lower Extremity Assessment: Overall WFL for tasks assessed    Cervical / Trunk Assessment Cervical / Trunk Assessment: Other exceptions Cervical / Trunk Exceptions: bil chest tubes  Communication   Communication: Other (comment)(orally intubated; writes clearly)  Cognition Arousal/Alertness: Awake/alert Behavior During Therapy: WFL for tasks assessed/performed Overall Cognitive Status: Within Functional Limits for tasks assessed  General Comments General comments (skin integrity, edema, etc.): orally intubated with VSS throughout    Exercises Other Exercises Other Exercises: squats x 5 reps Other Exercises: marching x 30 steps Other Exercises: heel raises x 2 with calf cramping Other Exercises: seated calf stretch   Provided handout with green and orange theraband:  Access Code: [email or web address]  Supine Shoulder Horizontal Abduction with Resistance - 1 x daily - 7 x weekly -   Supine PNF D2 Flexion with Resistance - 1 x daily - 7 x weekly Supine Ankle Pumps - 1 x daily - 7 x weekly - Supine Heel Slide - 1 x daily - 7 x weekly - Supine March - 1 x daily - 7 x weekly -  Supine Active Straight Leg Raise - 1 x daily - 7 x weekly - Supine Bridge - 1 x daily - 7 x weekly -    Assessment/Plan    PT Assessment Patient needs continued PT services  PT Problem List Decreased mobility;Decreased knowledge of precautions;Cardiopulmonary status limiting activity       PT Treatment Interventions Gait training;Functional mobility training;Therapeutic activities;Therapeutic exercise;Patient/family education    PT Goals (Current goals can be found in the Care Plan section)  Acute Rehab PT Goals Patient Stated Goal: to keep strength up PT Goal Formulation: With patient Time For Goal Achievement: 07/03/19 Potential to Achieve Goals: Good Additional Goals Additional Goal #1: Will score >=19/24 on DGI demonstrating lower/normal fall risk    Frequency Min 2X/week   Barriers to discharge        Co-evaluation               AM-PAC PT "6 Clicks" Mobility  Outcome Measure Help needed turning from your back to your side while in a flat bed without using bedrails?: A Little Help needed moving from lying on your back to sitting on the side of a flat bed without using bedrails?: A Little Help needed moving to and from a bed to a chair (including a wheelchair)?: A Little Help needed standing up from a chair using your arms (e.g., wheelchair or bedside chair)?: A Little Help needed to walk in hospital room?: A Little Help needed climbing 3-5 steps with a railing? : A Little 6 Click Score: 18    End of Session Equipment Utilized During Treatment: Oxygen Activity Tolerance: Patient tolerated treatment well Patient left: in chair;with call bell/phone within reach;with family/visitor present Nurse Communication: Mobility status PT Visit Diagnosis: Difficulty in walking,  not elsewhere classified (R26.2)    Time: [id number] PT Time Calculation (min) (ACUTE ONLY): 16 min   Charges:   PT Evaluation $PT Eval Moderate Complexity: 1 Mod           Arby Barrette, PT Pager [phone]   Rexanne Mano 06/19/2019, 3:45 PM

## 2019-06-20 ENCOUNTER — Inpatient Hospital Stay (HOSPITAL_COMMUNITY): Payer: BLUE CROSS/BLUE SHIELD

## 2019-06-20 LAB — PHOSPHORUS: Phosphorus: 3.3 mg/dL (ref 2.5–4.6)

## 2019-06-20 LAB — BASIC METABOLIC PANEL
Anion gap: 7 (ref 5–15)
BUN: 41 mg/dL — ABNORMAL HIGH (ref 8–23)
CO2: 30 mmol/L (ref 22–32)
Calcium: 8.9 mg/dL (ref 8.9–10.3)
Chloride: 109 mmol/L (ref 98–111)
Creatinine, Ser: 1.07 mg/dL (ref 0.61–1.24)
GFR calc Af Amer: >60 mL/min (ref >60)
GFR calc non Af Amer: >60 mL/min (ref >60)
Glucose, Bld: 111 mg/dL — ABNORMAL HIGH (ref 70–99)
Potassium: 4 mmol/L (ref 3.5–5.1)
Sodium: 146 mmol/L — ABNORMAL HIGH (ref 135–145)

## 2019-06-20 LAB — CBC
HCT: 37.6 % — ABNORMAL LOW (ref 39.0–52.0)
Hemoglobin: 12.2 g/dL — ABNORMAL LOW (ref 13.0–17.0)
MCH: 30.8 pg (ref 26.0–34.0)
MCHC: 32.4 g/dL (ref 30.0–36.0)
MCV: 94.9 fL (ref 80.0–100.0)
Platelets: 203 10*3/uL (ref 150–400)
RBC: 3.96 MIL/uL — ABNORMAL LOW (ref 4.22–5.81)
RDW: 13.2 % (ref 11.5–15.5)
WBC: 7.4 10*3/uL (ref 4.0–10.5)
nRBC: 0 % (ref 0.0–0.2)

## 2019-06-20 LAB — GLUCOSE, CAPILLARY
Glucose-Capillary: 102 mg/dL — ABNORMAL HIGH (ref 70–99)
Glucose-Capillary: 116 mg/dL — ABNORMAL HIGH (ref 70–99)
Glucose-Capillary: 101 mg/dL — ABNORMAL HIGH (ref 70–99)
Glucose-Capillary: 163 mg/dL — ABNORMAL HIGH (ref 70–99)
Glucose-Capillary: 141 mg/dL — ABNORMAL HIGH (ref 70–99)

## 2019-06-20 LAB — MAGNESIUM: Magnesium: 2.3 mg/dL (ref 1.7–2.4)

## 2019-06-20 MED ORDER — METHYLPREDNISOLONE SODIUM SUCC 125 MG IJ SOLR: 40.0000 mg | Freq: Two times a day (BID) | INTRAMUSCULAR | Status: DC

## 2019-06-20 MED ORDER — SODIUM CHLORIDE 0.9% FLUSH
10.0000 mL | Freq: Three times a day (TID) | INTRAVENOUS | Status: DC
  Administered 2019-06-20: 10 mL
  Administered 2019-06-20 (×2): 20 mL
  Administered 2019-06-21: 10 mL
  Administered 2019-06-21: 20 mL

## 2019-06-20 NOTE — Plan of Care (Signed)
  Problem: Clinical Measurements: Goal: Diagnostic test results will improve Outcome: Progressing Goal: Respiratory complications will improve Outcome: Progressing Note: Pt is off ventilator as of 5/29. Tolerated CPAP overnight well and is on 2L [place] this morning with oxygen saturations 99%. Will progress to RA as O2 sats allow.   Problem: Activity: Goal: Risk for activity intolerance will decrease Outcome: Progressing   Problem: Elimination: Goal: Will not experience complications related to bowel motility Outcome: Progressing Goal: Will not experience complications related to urinary retention Outcome: Progressing   Problem: Safety: Goal: Ability to remain free from injury will improve Outcome: Progressing   Problem: Skin Integrity: Goal: Risk for impaired skin integrity will decrease Outcome: Progressing   Problem: Nutrition: Goal: Adequate nutrition will be maintained Outcome: Not Progressing Note: Pt was extubated 5/29 with OG tube removed at that time. Unable to start diet due to swallow evaluation needing to be ordered. Will address this with MD this morning.

## 2019-06-20 NOTE — Progress Notes (Signed)
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  Admitted with acute respiratory failure due to subglottic stenosis, bilateral aspiration pneumonia, intubated in the ED.  CT chest is showing bilateral pulmonary infiltrates.   Past Medical History  Hypothyroidism Obstructive sleep apnea Subglottic stenosis Deviated septum   Subglottic stenosis- managed at [facility] (Dr. Joya Gaskins; last seen 2019)- circumferential narrowing, inflammation w/o cancer on 2015 bx. Chronic dysphonia. Had stridor on exam at last 2019 ENT office visit.  Had planned for SMDL with coblator ablation  in 2020 eval by Dr. Rogers Blocker in rheum due to concern for GPA Wife states that that he was told that he might need some more dilation of his subglottic stenosis.    Significant Hospital Events   5/23 Admit 5/24 Bradycardic arrest due to mucus plug from ETT and had to be re intubated. CPR for few minutes with good neuro function 5/25 Bilateral chest tubes for pneumothorax 5/29 Extubated, no stridor  Micro:  5/23 blood cx>> 5/23 urine cx>> NG 5/24 resp cx> moderate PMN, rare GPR>> normal flora 5/23 covid negative  Antibiotics:  Cefepime 5/23 vanc 5/23 Flagyl 5/23  Ceftriaxone 5/23>>  Significant diagnostic tests:  5/23 CT chest: dependent b/l LL infiltrates with air bronchograms, pulmonary edema. No PE.    Interval history   Tolerated extubation fine yesterday with no stridor. On CPAP overnight. On room air now  Objective   Blood pressure (!) 141/91, pulse (!) 58, temperature (!) 97.4 F (36.3 C), temperature source Axillary, resp. rate 18, height 5\' 8"  (1.727 m), weight 98.8 kg, SpO2 99 %.    Vent Mode: PSV;CPAP FiO2 (%):  [30 %] 30 % PEEP:  [5  cmH20] 5 cmH20 Pressure Support:  [8 cmH20] 8 cmH20   Intake/Output Summary (Last 24 hours) at 06/20/2019 1104 Last data filed at 06/20/2019 1000 Gross per 24 hour  Intake 330 ml  Output 840 ml  Net -510 ml   Filed Weights   06/18/19 0406 06/19/19 0152 06/20/19 0322  Weight: 99.1 kg 98.6 kg 98.8 kg    Examination:       Gen:      No acute distress HEENT:  EOMI, sclera anicteric Neck:     No masses; no thyromegaly Lungs:    Clear to auscultation bilaterally; normal respiratory effort CV:         Regular rate and rhythm; no murmurs Abd:      + bowel sounds; soft, non-tender; no palpable masses, no distension Ext:    No edema; adequate peripheral perfusion Skin:      Warm and dry; no rash Neuro: alert and oriented x 3 Psych: normal mood and affect  Lab are stable Chest x-ray with stable bilateral chest tubes, no pneumothorax.  Resolved problems:  PEA cardiac arrest due to mucus plugging Lactic acidosis Prolonged QTc Acute anemia, possibly dilutional - stable  Assessment & Plan:  Acute hypoxic respiratory failure secondary to aspiration pneumonia Chronic progressive subglottic stenosis.  prev followed at [facility] but wants to establish with a [facility].  Continue ceftriaxone to complete 7 days Wean off oxygen Started on Solu-Medrol for airway edema. Will reduce dose today to 40 mg q12 and can transition to prednisone tomorrow for a taper.  Discussed  with ENT.  If he remains stable post extubation then follow-up with Dr. Redmond Baseman as an outpatient for long-term management of subglottic stenosis.  Obstructive sleep apnea CPAP at night  Bilateral pneumothoraxes after CPR Place chest tubes to waterseal.  Repeat chest x-ray If there is no recurrence of Ptx we then can remove  Best practice:  Diet: PO diet Pain/Anxiety/Delirium protocol (if indicated): NA VAP protocol (if indicated): NA DVT prophylaxis: DC lovenox. Use SCDs GI prophylaxis: DC protonix Glucose control:  Monitor Mobility: OOB Code Status: Full code Family Communication: Wife and patient updated at bedside Disposition: Out of ICU and TRH service  Critical care time:    Marshell Garfinkel MD [place] [facility] Please see [email or web address] for pager details.  06/20/2019, 11:04 AM

## 2019-06-21 ENCOUNTER — Inpatient Hospital Stay (HOSPITAL_COMMUNITY): Payer: BLUE CROSS/BLUE SHIELD

## 2019-06-21 ENCOUNTER — Other Ambulatory Visit: Payer: Self-pay

## 2019-06-21 DIAGNOSIS — R918 Other nonspecific abnormal finding of lung field: Secondary | ICD-10-CM | POA: Diagnosis not present

## 2019-06-21 DIAGNOSIS — I469 Cardiac arrest, cause unspecified: Secondary | ICD-10-CM

## 2019-06-21 DIAGNOSIS — J9601 Acute respiratory failure with hypoxia: Secondary | ICD-10-CM | POA: Diagnosis not present

## 2019-06-21 DIAGNOSIS — J690 Pneumonitis due to inhalation of food and vomit: Secondary | ICD-10-CM | POA: Diagnosis not present

## 2019-06-21 DIAGNOSIS — Z4682 Encounter for fitting and adjustment of non-vascular catheter: Secondary | ICD-10-CM | POA: Diagnosis not present

## 2019-06-21 DIAGNOSIS — J9600 Acute respiratory failure, unspecified whether with hypoxia or hypercapnia: Secondary | ICD-10-CM | POA: Diagnosis not present

## 2019-06-21 DIAGNOSIS — S270XXD Traumatic pneumothorax, subsequent encounter: Secondary | ICD-10-CM | POA: Diagnosis not present

## 2019-06-21 DIAGNOSIS — G4733 Obstructive sleep apnea (adult) (pediatric): Secondary | ICD-10-CM | POA: Diagnosis not present

## 2019-06-21 LAB — CBC
HCT: 44.7 % (ref 39.0–52.0)
Hemoglobin: 14.5 g/dL (ref 13.0–17.0)
MCH: 30.7 pg (ref 26.0–34.0)
MCHC: 32.4 g/dL (ref 30.0–36.0)
MCV: 94.5 fL (ref 80.0–100.0)
Platelets: 216 10*3/uL (ref 150–400)
RBC: 4.73 MIL/uL (ref 4.22–5.81)
RDW: 13.2 % (ref 11.5–15.5)
WBC: 8.1 10*3/uL (ref 4.0–10.5)
nRBC: 0 % (ref 0.0–0.2)

## 2019-06-21 LAB — BASIC METABOLIC PANEL
Anion gap: 6 (ref 5–15)
BUN: 38 mg/dL — ABNORMAL HIGH (ref 8–23)
CO2: 31 mmol/L (ref 22–32)
Calcium: 8.9 mg/dL (ref 8.9–10.3)
Chloride: 108 mmol/L (ref 98–111)
Creatinine, Ser: 1.04 mg/dL (ref 0.61–1.24)
GFR calc Af Amer: >60 mL/min (ref >60)
GFR calc non Af Amer: >60 mL/min (ref >60)
Glucose, Bld: 98 mg/dL (ref 70–99)
Potassium: 4.6 mmol/L (ref 3.5–5.1)
Sodium: 145 mmol/L (ref 135–145)

## 2019-06-21 LAB — GLUCOSE, CAPILLARY
Glucose-Capillary: 120 mg/dL — ABNORMAL HIGH (ref 70–99)
Glucose-Capillary: 134 mg/dL — ABNORMAL HIGH (ref 70–99)
Glucose-Capillary: 151 mg/dL — ABNORMAL HIGH (ref 70–99)
Glucose-Capillary: 158 mg/dL — ABNORMAL HIGH (ref 70–99)
Glucose-Capillary: 76 mg/dL (ref 70–99)
Glucose-Capillary: 77 mg/dL (ref 70–99)
Glucose-Capillary: 86 mg/dL (ref 70–99)

## 2019-06-21 LAB — MAGNESIUM: Magnesium: 2.1 mg/dL (ref 1.7–2.4)

## 2019-06-21 LAB — PHOSPHORUS: Phosphorus: 1.8 mg/dL — ABNORMAL LOW (ref 2.5–4.6)

## 2019-06-21 MED ORDER — DOCUSATE SODIUM 50 MG/5ML PO LIQD: 100.0000 mg | Freq: Two times a day (BID) | ORAL | Status: DC | PRN

## 2019-06-21 MED ORDER — MELATONIN 3 MG PO TABS
3.0000 mg | ORAL_TABLET | Freq: Every evening | ORAL | Status: DC | PRN
  Filled 2019-06-21: qty 1

## 2019-06-21 MED ORDER — SENNOSIDES-DOCUSATE SODIUM 8.6-50 MG PO TABS
1.0000 | ORAL_TABLET | Freq: Every day | ORAL | Status: DC
  Administered 2019-06-21: 1 via ORAL
  Filled 2019-06-21: qty 1

## 2019-06-21 MED ORDER — ACETAMINOPHEN 160 MG/5ML PO SOLN: 500.0000 mg | Freq: Four times a day (QID) | ORAL | Status: DC | PRN

## 2019-06-21 MED ORDER — PREDNISONE 20 MG PO TABS
50.0000 mg | ORAL_TABLET | Freq: Every day | ORAL | Status: DC
  Administered 2019-06-21 – 2019-06-22 (×2): 50 mg via ORAL
  Filled 2019-06-21: qty 5
  Filled 2019-06-21: qty 2

## 2019-06-21 NOTE — Progress Notes (Signed)
PROGRESS NOTE    Chase Guzman  [id number] DOB: [birth year] DOA: 06/13/2019 PCP: [name], MD   Brief Narrative:  HPI on 06/13/2019 by Dr. 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  Most of the history was taken from the wife and staff.  Patient was sedated with fentanyl but easily arousable and answers questions by nodding his head.  Patient was hypotensive with blood pressure of 58/47.  As per wife patient has been noncompliant with his CPAP over the last week or so and has been very frustrated with it due to stress is work.  He has not complained of any sputum, hemoptysis or fever.  As per wife patient was running his errands yesterday with no issues.  She states that he was told that he might need some more dilation of his subglottic stenosis..  Patient was a difficult intubation as per report.  Patient did get his 2 shots of vaccine.  Patient is also Covid negative today.  CT chest is showing bilateral pulmonary infiltrates.   Assessment & Plan   Acute hypoxic respiratory failure secondary to aspiration pneumonia/ Chronic progressive subglottic stenosis -Was following with ENT at [facility] however wants to establish care in [place] -Patient has been extubated and currently weaned off of oxygen.  On room air -Initially was started on Solu-Medrol, however have started patient on prednisone today -Patient to follow with Dr. Redmond Baseman, ENT as an outpatient -Patient completed course of ceftriaxone  Obstructive sleep -Continue CPAP  nightly  Bilateral thoraces after CPR -Patient currently has chest tubes in place to waterseal -Repeat chest x-ray unremarkable for pneumothorax -Will discuss with PCCM regarding chest tube removal today  Cardiac arrest/bradycardia -Likely due to mucous plugging of ETT -Has resolved  Prolonged QTC -Resolved  Acute normocytic anemia -Possibly dilutional component -Appears resolved, hemoglobin currently 14.5  Deconditioning -Likely secondary to the above -PT evaluated patient, no further PT needs  DVT Prophylaxis  SCDs  Code Status: Full  Family Communication: None at bedside  Disposition Plan:  Status is: Inpatient  Remains inpatient appropriate because:has chest tubes in place  Dispo: The patient is from: Home              Anticipated d/c is to: Home              Anticipated d/c date is: 1 day              Patient currently is not medically stable to d/c.  Consultants PCCM  Significant events/Procedures 5/23 Admit 5/24 Bradycardic arrest due to mucus plug from ETT and had to be re intubated. CPR for few minutes with good neuro function 5/25 Bilateral chest tubes for pneumothorax 5/29 Extubated, no stridor  Antibioti/cs   Anti-infectives (From admission, onward)   Start     Dose/Rate Route Frequency Ordered Stop   06/13/19 1800  cefTRIAXone (ROCEPHIN) 1 g in sodium chloride 0.9 % 100 mL IVPB     1 g 200 mL/hr over 30 Minutes Intravenous Every 24 hours 06/13/19 0655 06/19/19 1756  06/13/19 1400  metroNIDAZOLE (FLAGYL) IVPB 500 mg  Status:  Discontinued     500 mg 100 mL/hr over 60 Minutes Intravenous Every 8 hours 06/13/19 0655 06/14/19 1245   06/13/19 0500  aztreonam (AZACTAM) 2 g in sodium chloride 0.9 % 100 mL IVPB  Status:  Discontinued     2 g 200 mL/hr over 30 Minutes Intravenous  Once 06/13/19 0445 06/13/19 0450   06/13/19 0500  metroNIDAZOLE (FLAGYL) IVPB 500 mg     500 mg 100 mL/hr over 60 Minutes Intravenous  Once 06/13/19 0445 06/13/19 1012    06/13/19 0500  vancomycin (VANCOCIN) IVPB 1000 mg/200 mL premix  Status:  Discontinued     1,000 mg 200 mL/hr over 60 Minutes Intravenous  Once 06/13/19 0445 06/13/19 0450   06/13/19 0500  vancomycin (VANCOREADY) IVPB 2000 mg/400 mL     2,000 mg 200 mL/hr over 120 Minutes Intravenous  Once 06/13/19 0450 06/13/19 0822   06/13/19 0500  ceFEPIme (MAXIPIME) 2 g in sodium chloride 0.9 % 100 mL IVPB     2 g 200 mL/hr over 30 Minutes Intravenous  Once 06/13/19 0450 06/13/19 [id number]      Subjective:   Chase Guzman seen and examined today.  Patient feeling better this morning.  Hopes to get chest tubes removed today and get out of bed and walk around.  Hoping to go home tomorrow.  States he was able to eat yesterday.  Denies current chest pain or shortness of breath, abdominal pain, nausea or vomiting, dizziness or headache.  Objective:   Vitals:   06/21/19 0600 06/21/19 0700 06/21/19 0726 06/21/19 0800  BP:      Pulse: 60 67  70  Resp:      Temp:   98.3 F (36.8 C)   TempSrc:   Axillary   SpO2: 92% 95%  100%  Weight:      Height:        Intake/Output Summary (Last 24 hours) at 06/21/2019 0900 Last data filed at 06/21/2019 0645 Gross per 24 hour  Intake 1260 ml  Output 1002 ml  Net 258 ml   Filed Weights   06/19/19 0152 06/20/19 0322 06/21/19 0500  Weight: 98.6 kg 98.8 kg 98.6 kg    Exam  General: Well developed, well nourished, NAD, appears stated age  62: NCAT, mucous membranes moist.   Cardiovascular: S1 S2 auscultated, RRR, no murmur  Respiratory: Clear to auscultation bilaterally   Abdomen: Soft, nontender, nondistended, + bowel sounds  Extremities: warm dry without cyanosis clubbing or edema  Neuro: AAOx3, nonfocal  Psych: Normal affect and demeanor with intact judgement and insight   Data Reviewed: I have personally reviewed following labs and imaging studies  CBC: Recent Labs  Lab 06/14/19 2058 06/15/19 0338 06/18/19 0248 06/20/19 0153 06/21/19 0216    WBC  --  7.8 7.0 7.4 8.1  HGB 12.6* 12.8* 12.4* 12.2* 14.5  HCT 37.0* 38.5* 37.1* 37.6* 44.7  MCV  --  92.3 92.3 94.9 94.5  PLT  --  171 189 203 [id number]   Basic Metabolic Panel: Recent Labs  Lab 06/16/19 0256 06/17/19 0500 06/18/19 0248 06/20/19 0153 06/21/19 0216  NA 141 147* 146* 146* 145  K 4.0 3.9 3.7 4.0 4.6  CL 108 110 112* 109 108  CO2 24 26 27 30 31   GLUCOSE 187* 141* 137* 111* 98  BUN 30* 40* 39* 41* 38*  CREATININE 1.09 1.06 0.95 1.07 1.04  CALCIUM 8.7* 9.3 9.2 8.9 8.9  MG  --   --   --  2.3 2.1  PHOS  --   --   --  3.3 1.8*   GFR: Estimated Creatinine Clearance: 73.1 mL/min (by C-G formula based on SCr of 1.04 mg/dL). Liver Function Tests: No results for input(s): AST, ALT, ALKPHOS, BILITOT, PROT, ALBUMIN in the last 168 hours. No results for input(s): LIPASE, AMYLASE in the last 168 hours. No results for input(s): AMMONIA in the last 168 hours. Coagulation Profile: No results for input(s): INR, PROTIME in the last 168 hours. Cardiac Enzymes: No results for input(s): CKTOTAL, CKMB, CKMBINDEX, TROPONINI in the last 168 hours. BNP (last 3 results) No results for input(s): PROBNP in the last 8760 hours. HbA1C: No results for input(s): HGBA1C in the last 72 hours. CBG: Recent Labs  Lab 06/20/19 1648 06/20/19 1938 06/21/19 0004 06/21/19 0346 06/21/19 0728  GLUCAP 163* 141* 86 77 76   Lipid Profile: No results for input(s): CHOL, HDL, LDLCALC, TRIG, CHOLHDL, LDLDIRECT in the last 72 hours. Thyroid Function Tests: No results for input(s): TSH, T4TOTAL, FREET4, T3FREE, THYROIDAB in the last 72 hours. Anemia Panel: No results for input(s): VITAMINB12, FOLATE, FERRITIN, TIBC, IRON, RETICCTPCT in the last 72 hours. Urine analysis:    Component Value Date/Time   COLORURINE YELLOW 06/13/2019 0645   APPEARANCEUR HAZY (A) 06/13/2019 0645   LABSPEC 1.043 (H) 06/13/2019 0645   PHURINE 6.0 06/13/2019 0645   GLUCOSEU 50 (A) 06/13/2019 0645   HGBUR NEGATIVE  06/13/2019 0645   BILIRUBINUR NEGATIVE 06/13/2019 0645   KETONESUR NEGATIVE 06/13/2019 0645   PROTEINUR >=300 (A) 06/13/2019 0645   NITRITE NEGATIVE 06/13/2019 0645   LEUKOCYTESUR NEGATIVE 06/13/2019 0645   Sepsis Labs: @LABRCNTIP (procalcitonin:4,lacticidven:4)  ) Recent Results (from the past 240 hour(s))  SARS Coronavirus 2 by RT PCR (hospital order, performed in [place] hospital lab) Nasopharyngeal Nasopharyngeal Swab     Status: None   Collection Time: 06/13/19  4:36 AM   Specimen: Nasopharyngeal Swab  Result Value Ref Range Status   SARS Coronavirus 2 NEGATIVE NEGATIVE Final    Comment: (NOTE) SARS-CoV-2 target nucleic acids are NOT DETECTED. The SARS-CoV-2 RNA is generally detectable in upper and lower respiratory specimens during the acute phase of infection. The lowest concentration of SARS-CoV-2 viral copies this assay can detect is 250 copies / mL. A negative result does not preclude SARS-CoV-2 infection and should not be used as the sole basis for treatment or other patient management decisions.  A negative result may occur with improper specimen collection / handling, submission of specimen other than nasopharyngeal swab, presence of viral mutation(s) within the areas targeted by this assay, and inadequate number of viral copies (<250 copies / mL). A negative result must be combined with clinical observations, patient history, and epidemiological information. Fact Sheet for Patients:   StrictlyIdeas.no Fact Sheet for Healthcare Providers: BankingDealers.co.za This test is not yet approved or cleared  by the [place] FDA and has been authorized for detection and/or diagnosis of SARS-CoV-2 by FDA under an Emergency Use Authorization (EUA).  This EUA will remain in effect (meaning this test can be used) for the duration of the COVID-19 declaration under Section 564(b)(1) of the Act, 21 U.S.C. section  360bbb-3(b)(1), unless the authorization is terminated or revoked sooner. Performed at [facility], [place] [address] [place], [place] [address]   Blood culture (routine x 2)     Status: None   Collection Time: 06/13/19  4:40 AM   Specimen: BLOOD  Result Value Ref Range Status   Specimen Description BLOOD  RIGHT IV START  Final   Special Requests   Final    BOTTLES DRAWN AEROBIC AND ANAEROBIC Blood Culture results may not be optimal due to an inadequate volume of blood received in culture bottles   Culture   Final    NO GROWTH 5 DAYS Performed at [facility], [place] [address], [place], [place] [address]    Report Status 06/18/2019 FINAL  Final  Blood culture (routine x 2)     Status: None   Collection Time: 06/13/19  4:40 AM   Specimen: BLOOD  Result Value Ref Range Status   Specimen Description BLOOD RIGHT IV START  Final   Special Requests   Final    BOTTLES DRAWN AEROBIC AND ANAEROBIC Blood Culture results may not be optimal due to an inadequate volume of blood received in culture bottles   Culture   Final    NO GROWTH 5 DAYS Performed at [facility], [place] [address] [place], [place] [address]    Report Status 06/18/2019 FINAL  Final  Urine culture     Status: None   Collection Time: 06/13/19  6:45 AM   Specimen: In/Out Cath Urine  Result Value Ref Range Status   Specimen Description IN/OUT CATH URINE  Final   Special Requests NONE  Final   Culture   Final    NO GROWTH Performed at [facility], [place] [address], [place], [place] [address]    Report Status 06/14/2019 FINAL  Final  MRSA PCR Screening     Status: None   Collection Time: 06/13/19  8:54 AM   Specimen: Nasopharyngeal  Result Value Ref Range Status   MRSA by PCR NEGATIVE NEGATIVE Final    Comment:        The GeneXpert MRSA Assay (FDA approved for NASAL specimens only), is one component of a comprehensive MRSA colonization surveillance program. It is not intended to diagnose  MRSA infection nor to guide or monitor treatment for MRSA infections. Performed at [facility], [place] [address] [place], [place] [address]   Culture, respiratory (non-expectorated)     Status: None   Collection Time: 06/14/19 11:44 AM   Specimen: Tracheal Aspirate; Respiratory  Result Value Ref Range Status   Specimen Description TRACHEAL ASPIRATE  Final   Special Requests NONE  Final   Gram Stain   Final    MODERATE WBC PRESENT, PREDOMINANTLY PMN RARE GRAM POSITIVE RODS    Culture   Final    RARE Consistent with normal respiratory flora. Performed at [facility], [place] [address], [place], [place] [address]    Report Status 06/16/2019 FINAL  Final      Radiology Studies: DG Chest Port 1 View  Result Date: 06/21/2019 CLINICAL DATA:  Acute respiratory failure EXAM: PORTABLE CHEST 1 VIEW COMPARISON:  Chest radiograph from one day prior. FINDINGS: Stable bilateral pigtail chest tube positions. Stable cardiomediastinal silhouette with top-normal heart size. No pneumothorax. No pleural effusion. No overt pulmonary edema. Mild bibasilar scarring versus atelectasis, stable. IMPRESSION: 1. No pneumothorax. Stable bilateral pigtail chest tubes. 2. Mild bibasilar scarring versus atelectasis, stable. Electronically Signed   By: Ilona Sorrel M.D.   On: 06/21/2019 06:00   DG CHEST PORT 1 VIEW  Result Date: 06/20/2019 CLINICAL DATA:  Pneumothorax EXAM: PORTABLE CHEST 1 VIEW COMPARISON:  06/20/2019 FINDINGS: Bilateral small bore chest tubes in place. No pneumothorax. Normal cardiac silhouette. No pleural fluid. Improved atelectasis at the lung bases. IMPRESSION: 1.  Bilateral chest tubes in place.  No pneumothorax. 2. Improved basilar atelectasis. Electronically Signed   By: Suzy Bouchard M.D.   On: 06/20/2019 15:22   DG Chest Port 1 View  Result Date: 06/20/2019 CLINICAL DATA:  Acute respiratory failure EXAM: PORTABLE CHEST 1 VIEW COMPARISON:  Chest radiograph from one day prior.  FINDINGS: Stable position of bilateral pigtail chest tubes. Interval extubation and removal enteric tube. Stable cardiomediastinal silhouette with mild cardiomegaly. No pneumothorax. No pleural effusion. No overt pulmonary edema. Mild bibasilar atelectasis, stable. IMPRESSION: 1. No pneumothorax. Stable bilateral pigtail chest tubes. 2. Stable mild bibasilar atelectasis. 3. Stable mild cardiomegaly without overt pulmonary edema. Electronically Signed   By: Ilona Sorrel M.D.   On: 06/20/2019 05:02     Scheduled Meds: . Chlorhexidine Gluconate Cloth  6 each Topical Daily  . doxazosin  2 mg Per Tube Daily  . finasteride  5 mg Oral Daily  . insulin aspart  2-6 Units Subcutaneous Q4H  . predniSONE  50 mg Oral Q breakfast  . senna-docusate  1 tablet Per Tube Q1400  . sodium chloride flush  10 mL Intracatheter Q8H   Continuous Infusions: . sodium chloride Stopped (06/19/19 2300)     LOS: 8 days   Time Spent in minutes   45 minutes  Youlanda Tomassetti D.O. on 06/21/2019 at 9:00 AM  Between 7am to 7pm - Please see pager noted on [email or web address]  After 7pm go to [email or web address]  And look for the night coverage person covering for me after hours  [facility] Office  [phone]

## 2019-06-21 NOTE — Progress Notes (Signed)
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  Admitted with acute respiratory failure due to subglottic stenosis, bilateral aspiration pneumonia, intubated in the ED.  CT chest is showing bilateral pulmonary infiltrates.   Past Medical History  Hypothyroidism Obstructive sleep apnea Subglottic stenosis Deviated septum   Subglottic stenosis- managed at [facility] (Dr. Joya Gaskins; last seen 2019)- circumferential narrowing, inflammation w/o cancer on 2015 bx. Chronic dysphonia. Had stridor on exam at last 2019 ENT office visit.  Had planned for SMDL with coblator ablation  in 2020 eval by Dr. Rogers Blocker in rheum due to concern for GPA Wife states that that he was told that he might need some more dilation of his subglottic stenosis.    Significant Hospital Events   5/23 Admit 5/24 Bradycardic arrest due to mucus plug from ETT and had to be re intubated. CPR for few minutes with good neuro function 5/25 Bilateral chest tubes for pneumothorax 5/29 Extubated, no stridor 5/31 chest tubes removed  Micro:  5/23 blood cx>> 5/23 urine cx>> NG 5/24 resp cx> moderate PMN, rare GPR>> normal flora 5/23 covid negative  Antibiotics:  Cefepime 5/23 vanc 5/23 Flagyl 5/23  Ceftriaxone 5/23>>  Significant diagnostic tests:  5/23 CT chest: dependent b/l LL infiltrates with air bronchograms, pulmonary edema. No PE.    Interval history   No issues overnight  Objective   Blood pressure 111/73, pulse 69, temperature 98.3 F (36.8 C), temperature source Axillary, resp. rate (!) 22, height 5\' 8"  (1.727 m), weight 98.6 kg, SpO2 97 %.        Intake/Output Summary (Last 24 hours) at 06/21/2019 1133 Last data filed at 06/21/2019 1000 Gross per 24  hour  Intake 1380 ml  Output 877 ml  Net 503 ml   Filed Weights   06/19/19 0152 06/20/19 0322 06/21/19 0500  Weight: 98.6 kg 98.8 kg 98.6 kg    Examination:       Blood pressure 111/73, pulse 69, temperature 98.3 F (36.8 C), temperature source Axillary, resp. rate (!) 22, height 5\' 8"  (1.727 m), weight 98.6 kg, SpO2 97 %. Gen:      No acute distress HEENT:  EOMI, sclera anicteric Neck:     No masses; no thyromegaly Lungs:    Clear to auscultation bilaterally; normal respiratory effort CV:         Regular rate and rhythm; no murmurs Abd:      + bowel sounds; soft, non-tender; no palpable masses, no distension Ext:    No edema; adequate peripheral perfusion Skin:      Warm and dry; no rash Neuro: alert and oriented x 3 Psych: normal mood and affect  Lab are stable Chest x-ray today with stable bilateral chest tubes, no pneumothorax.  Resolved problems:  PEA cardiac arrest due to mucus plugging Lactic acidosis Prolonged QTc Acute anemia, possibly dilutional - stable  Assessment & Plan:  Acute hypoxic respiratory failure secondary to aspiration pneumonia Chronic progressive subglottic stenosis.  prev followed at [facility] but wants to establish with a [facility].  Continue ceftriaxone to complete 7 days Wean off oxygen Taper prednisone over 7 days Discussed with ENT.  If he remains stable post extubation then follow-up with Dr. Redmond Baseman as an outpatient for long-term management  of subglottic stenosis.  Obstructive sleep apnea CPAP at night  Bilateral pneumothoraxes after CPR No Ptx today on water seal. Will DC chest tubes  PCCM will be available as needed. Please call with questions  Best practice:  Diet: PO diet Pain/Anxiety/Delirium protocol (if indicated): NA VAP protocol (if indicated): NA DVT prophylaxis: DC lovenox. Use SCDs GI prophylaxis: DC protonix Glucose control: Monitor Mobility: OOB Code Status: Full code Family Communication: Wife and patient  updated at bedside Disposition: Out of ICU and TRH service  Critical care time:    Marshell Garfinkel MD [facility] Please see [email or web address] for pager details.  06/21/2019, 11:33 AM

## 2019-06-21 NOTE — Progress Notes (Signed)
Bilateral chest tubes removed. No complications. Chest x-ray ordered. Will continue to monitor.

## 2019-06-22 DIAGNOSIS — G4733 Obstructive sleep apnea (adult) (pediatric): Secondary | ICD-10-CM

## 2019-06-22 DIAGNOSIS — R001 Bradycardia, unspecified: Secondary | ICD-10-CM

## 2019-06-22 LAB — GLUCOSE, CAPILLARY
Glucose-Capillary: 112 mg/dL — ABNORMAL HIGH (ref 70–99)
Glucose-Capillary: 147 mg/dL — ABNORMAL HIGH (ref 70–99)

## 2019-06-22 MED ORDER — FINASTERIDE 5 MG PO TABS: 5.0000 mg | ORAL_TABLET | Freq: Every day | ORAL | 0 refills | Status: DC

## 2019-06-22 MED ORDER — PREDNISONE 10 MG PO TABS: ORAL_TABLET | ORAL | 0 refills | Status: DC

## 2019-06-22 NOTE — Care Management Important Message (Signed)
Important Message  Patient Details  Name: Chase Guzman MRN: [id number] Date of Birth: 04-10-1945   Medicare Important Message Given:  Yes - Important Message mailed due to current National Emergency  Verbal consent obtained due to current National Emergency  Relationship to patient: Spouse/Significant Other Contact Name: Kasson Mulhern Call Date: 06/22/19  Time: 1013 Phone: [phone] Outcome: Spoke with contact Important Message mailed to: Patient address on file    Delorse Lek 06/22/2019, 10:13 AM

## 2019-06-22 NOTE — Progress Notes (Signed)
Patient and family anxiously awaiting discharge for patient.   Patient was discharged home by MD order; discharged instructions  review and give to patient with care notes and prescriptions; IV DIC; skin intact; patient will be escorted to the car by nurse tech via wheelchair.  Patient and family have all his belongings upon discharge and being escorted out .

## 2019-06-22 NOTE — Care Management (Signed)
Pt deemed appropriate for discharge home today.  CM reviewed pt's chart for TOC needs/consults - none found.  Discharge order signed - CM signing off

## 2019-06-22 NOTE — Discharge Summary (Signed)
Physician Discharge Summary  Chase Guzman [id number] DOB: 04-May-1945 DOA: 06/13/2019  PCP: [name], MD  Admit date: 06/13/2019 Discharge date: 06/22/2019  Time spent: 45 minutes  Recommendations for Outpatient Follow-up:  Patient will be discharged to home.  Patient will need to follow up with primary care provider within one week of discharge.  Follow up with ENT. Patient should continue medications as prescribed.  Patient should follow a regular diet.   Discharge Diagnoses:  Acute hypoxic respiratory failure secondary to aspiration pneumonia/ Chronic progressive subglottic stenosis Obstructive sleep Bilateral thoraces after CPR Cardiac arrest/bradycardia Prolonged QTC Acute normocytic anemia Deconditioning  Discharge Condition: Stable  Diet recommendation: regular  Filed Weights   06/20/19 0322 06/21/19 0500 06/22/19 0500  Weight: 98.8 kg 98.6 kg 101 kg    History of present illness:  on 06/13/2019 by Dr. 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 Most of the history was taken from the wife and staff. Patient was sedated with fentanyl but easily arousable and answers questions by nodding his head. Patient was hypotensive with blood pressure of 58/47. As per wife patient has been noncompliant with his CPAP over the last week or so and has been very frustrated with it due to stress is work. He has not complained of any sputum,hemoptysis or fever. As per wife patient was running his errands yesterday with no issues. She  states that he was told that he might need some more dilation of his subglottic stenosis.. Patient was a difficult intubation as per report. Patient did get his 2 shots of vaccine. Patient is also Covid negative today. CT chest is showing bilateral pulmonary infiltrates.   Hospital Course:  Acute hypoxic respiratory failure secondary to aspiration pneumonia/ Chronic progressive subglottic stenosis -Was following with ENT at [facility] however wants to establish care in [place] -Patient has been extubated and currently weaned off of oxygen.  On room air -Initially was started on Solu-Medrol, however transitioned to prednisone -Discussed prednisone taper with PCCM, Dr. Vaughan Browner, recommended tapering by 10mg  q2 days. -Patient to follow with Dr. Redmond Baseman, ENT as an outpatient -Patient completed course of ceftriaxone  Obstructive sleep -Continue CPAP nightly  Bilateral thoraces after CPR -Patient currently has chest tubes in place to waterseal -Repeat chest x-ray unremarkable for pneumothorax -Chest tubes removed on 06/21/2019   Cardiac arrest/bradycardia -Likely due to mucous plugging of ETT -Has resolved  Prolonged QTC -Resolved  Acute normocytic anemia -Possibly dilutional component -Appears resolved, hemoglobin currently 14.5  Deconditioning -Likely secondary to the above -PT evaluated patient, no further PT needs  Consultants PCCM  Significant events/Procedures 5/23 Admit 5/24 Bradycardic arrest due to mucus plug from ETT and had to be re intubated. CPR for few minutes with good neuro function 5/25 Bilateral chest tubes for pneumothorax 5/29 Extubated, no stridor  Discharge Exam: Vitals:   06/21/19 2035 06/22/19 0521  BP: 127/86 130/82  Pulse: 67 (!) 57  Resp: 18 18  Temp: 97.9 F (36.6 C) 97.7 F (36.5 C)  SpO2: 98% 97%    Exam  General:  Well developed, well nourished, NAD, appears stated age  23: NCAT, mucous membranes moist.    Cardiovascular: S1 S2 auscultated, RRR, no murmur  Respiratory: Clear to auscultation bilaterally   Chest: dressings in place where chest tubes were previously  Abdomen: Soft, nontender, nondistended, + bowel sounds  Extremities: warm dry without cyanosis clubbing or edema  Neuro: AAOx3, nonfocal  Psych: Pleasant, appropriate mood and affect  Discharge Instructions Discharge Instructions    Discharge instructions   Complete by: As directed    Patient will be discharged to home.  Patient will need to follow up with primary care provider within one week of discharge.  Follow up with ENT. Patient should continue medications as prescribed.  Patient should follow a regular diet.     Allergies as of 06/22/2019      Reactions   Amoxicillin Rash   Penicillins Rash      Medication List    STOP taking these medications   Fluticasone Propionate 93 MCG/ACT Exhu Commonly known as: Xhance     TAKE these medications   finasteride 5 MG tablet Commonly known as: PROSCAR Take 1 tablet (5 mg total) by mouth daily.   levothyroxine 75 MCG tablet Commonly known as: SYNTHROID Take 75 mcg by mouth daily before breakfast.   mometasone 0.1 % cream Commonly known as: ELOCON Apply 1 application topically as needed for rash.   predniSONE 10 MG tablet Commonly known as: DELTASONE Take 5 tablets for 2 days, then take 4 tablets for 2 days, then 3 tablets for 2 days, then 2 tablets for 2 days, then 1 tablet for 2 days.   VITAMIN D PO Take 1 tablet by mouth daily.      Allergies  Allergen Reactions  . Amoxicillin Rash  . Penicillins Rash   Follow-up Information    [name], MD. Schedule an appointment as soon as possible for a visit in 1 week(s).   Specialty: Family Medicine Why: Hospital follow up Contact information: [place] [address] [phone]            The results of significant diagnostics from this hospitalization (including  imaging, microbiology, ancillary and laboratory) are listed below for reference.    Significant Diagnostic Studies: DG Abd 1 View  Result Date: 06/13/2019 CLINICAL DATA:  Nasogastric tube placement. EXAM: ABDOMEN - 1 VIEW COMPARISON:  None. FINDINGS: A nasogastric tube is seen with its distal tip overlying the distal esophagus, just above the level of the gastroesophageal junction. Moderate severity infiltrates are seen within the mid right lung and bilateral lung bases. The bowel gas pattern is normal. No radio-opaque calculi or other significant radiographic abnormality are seen. IMPRESSION: 1. Nasogastric tube with its distal tip overlying the distal esophagus, just above the level of the gastroesophageal junction. Further advancement of the NG tube by approximately 7 cm is recommended to decrease the risk of aspiration. 2. Moderate severity bilateral infiltrates. Electronically Signed   By: Virgina Norfolk M.D.   On: 06/13/2019 22:21   DG CHEST PORT 1 VIEW  Result Date: 06/21/2019 CLINICAL DATA:  Removal of right chest tube. EXAM: PORTABLE CHEST 1 VIEW 06/21/2019 at 12:36 p.m. COMPARISON:  Chest x-rays dated 06/21/2019 at 5:25 a.m. and 06/14/2019 and chest CT dated 06/13/2019 FINDINGS: The right chest tube is been removed.  No pneumothorax. Minimal residual haziness at the right lung base. Minimal linear atelectasis at the left lung base. Heart size and vascularity are normal. No bone abnormalities. IMPRESSION: 1. No  pneumothorax after chest tube removal. 2. Minimal residual hazy infiltrate at the right lung base. Electronically Signed   By: Lorriane Shire M.D.   On: 06/21/2019 12:44   DG Chest Port 1 View  Result Date: 06/21/2019 CLINICAL DATA:  Acute respiratory failure EXAM: PORTABLE CHEST 1 VIEW COMPARISON:  Chest radiograph from one day prior. FINDINGS: Stable bilateral pigtail chest tube positions. Stable cardiomediastinal silhouette with top-normal heart size. No pneumothorax. No pleural  effusion. No overt pulmonary edema. Mild bibasilar scarring versus atelectasis, stable. IMPRESSION: 1. No pneumothorax. Stable bilateral pigtail chest tubes. 2. Mild bibasilar scarring versus atelectasis, stable. Electronically Signed   By: Ilona Sorrel M.D.   On: 06/21/2019 06:00   DG CHEST PORT 1 VIEW  Result Date: 06/20/2019 CLINICAL DATA:  Pneumothorax EXAM: PORTABLE CHEST 1 VIEW COMPARISON:  06/20/2019 FINDINGS: Bilateral small bore chest tubes in place. No pneumothorax. Normal cardiac silhouette. No pleural fluid. Improved atelectasis at the lung bases. IMPRESSION: 1. Bilateral chest tubes in place.  No pneumothorax. 2. Improved basilar atelectasis. Electronically Signed   By: Suzy Bouchard M.D.   On: 06/20/2019 15:22   DG Chest Port 1 View  Result Date: 06/20/2019 CLINICAL DATA:  Acute respiratory failure EXAM: PORTABLE CHEST 1 VIEW COMPARISON:  Chest radiograph from one day prior. FINDINGS: Stable position of bilateral pigtail chest tubes. Interval extubation and removal enteric tube. Stable cardiomediastinal silhouette with mild cardiomegaly. No pneumothorax. No pleural effusion. No overt pulmonary edema. Mild bibasilar atelectasis, stable. IMPRESSION: 1. No pneumothorax. Stable bilateral pigtail chest tubes. 2. Stable mild bibasilar atelectasis. 3. Stable mild cardiomegaly without overt pulmonary edema. Electronically Signed   By: Ilona Sorrel M.D.   On: 06/20/2019 05:02   DG CHEST PORT 1 VIEW  Result Date: 06/19/2019 CLINICAL DATA:  Pneumothorax EXAM: PORTABLE CHEST 1 VIEW COMPARISON:  06/17/2019 chest radiograph. FINDINGS: Endotracheal tube tip is 4.4 cm above the carina. Enteric tube enters stomach with the tip not seen on this image. Bilateral chest tubes are stable in position. Stable cardiomediastinal silhouette with mild cardiomegaly. No pneumothorax. No pleural effusion. No overt pulmonary edema. Mild bibasilar atelectasis, similar. IMPRESSION: 1. No pneumothorax. Stable bilateral  chest tubes. 2. Stable mild bibasilar atelectasis. 3. Stable mild cardiomegaly without overt pulmonary edema. Electronically Signed   By: Ilona Sorrel M.D.   On: 06/19/2019 06:50   DG CHEST PORT 1 VIEW  Result Date: 06/17/2019 CLINICAL DATA:  Follow-up bilateral pneumothoraces. Endotracheally intubated. EXAM: PORTABLE CHEST 1 VIEW COMPARISON:  06/16/2019 FINDINGS: Endotracheal tube and nasogastric tube remain in expected position. Bilateral pleural pigtail catheters remain in place, and no pneumothorax visualized. Cardiomegaly and low lung volumes are again seen. Mild bibasilar atelectasis is seen, however there is no evidence of pulmonary consolidation or pleural effusion. IMPRESSION: Mild bibasilar atelectasis. No pneumothorax visualized. Electronically Signed   By: Marlaine Hind M.D.   On: 06/17/2019 09:53   DG CHEST PORT 1 VIEW  Result Date: 06/16/2019 CLINICAL DATA:  Pneumothorax. EXAM: PORTABLE CHEST 1 VIEW COMPARISON:  06/15/2019 FINDINGS: The endotracheal tube terminates 1.7 cm above the carina, slightly lower than on the prior study. The enteric tube courses into the upper abdomen with tip not imaged. Bilateral pigtail thoracostomy tubes remain in place. The cardiac silhouette remains enlarged. Right greater than left mid and lower lung opacities are unchanged. No sizable pleural effusion or pneumothorax is identified. A small amount of soft tissue emphysema is noted in the left lateral lower chest wall. IMPRESSION: 1. Unchanged right greater than left mid  and lower lung airspace opacities which may reflect atelectasis, pneumonia, or aspiration. 2. No pneumothorax identified with bilateral thoracostomy tubes in place. Electronically Signed   By: Logan Bores M.D.   On: 06/16/2019 08:42   DG CHEST PORT 1 VIEW  Result Date: 06/15/2019 CLINICAL DATA:  Pneumothorax EXAM: PORTABLE CHEST 1 VIEW COMPARISON:  Portable exam at 1152 hrs compared to 0528 hrs FINDINGS: New BILATERAL pigtail thoracostomy  tubes. Tip of endotracheal tube projects 3.1 cm above carina. Nasogastric tube extends into stomach. External pacing leads. Enlargement of cardiac silhouette with stable mediastinal contours and pulmonary vascularity. Resolution of previously identified pneumothoraces. RIGHT basilar atelectasis, improved. No definite infiltrate or effusion. IMPRESSION: Resolution of BILATERAL pneumothoraces following thoracostomy tube placement. Electronically Signed   By: Lavonia Dana M.D.   On: 06/15/2019 12:16   DG CHEST PORT 1 VIEW  Result Date: 06/15/2019 CLINICAL DATA:  Shortness of breath. EXAM: PORTABLE CHEST 1 VIEW COMPARISON:  Jun 14, 2019. FINDINGS: Stable cardiomegaly. Endotracheal and nasogastric tubes are unchanged in position. Mild right upper lobe opacity is noted medially concerning for atelectasis. Stable mild left apical pneumothorax is noted. Stable small right apical pneumothorax is noted as well. Subcutaneous emphysema is seen overlying the left lateral chest wall. Mild bibasilar subsegmental atelectasis or infiltrates are noted. Bony thorax is unremarkable. IMPRESSION: Stable support apparatus. Mild right upper lobe atelectasis is noted medially. Stable bilateral apical pneumothoraces are noted, left greater than right. Mild bibasilar subsegmental atelectasis is noted. Electronically Signed   By: Marijo Conception M.D.   On: 06/15/2019 08:43   DG CHEST PORT 1 VIEW  Result Date: 06/14/2019 CLINICAL DATA:  Status post cardiac arrest. EXAM: PORTABLE CHEST 1 VIEW COMPARISON:  Jun 14, 2019 (9:24 p.m.) FINDINGS: There is stable endotracheal tube positioning. Interval nasogastric tube placement is seen with its distal end extending into the body of the stomach. Persistent mild to moderate severity areas of atelectasis and/or infiltrate are seen within the bilateral lung bases and mid right lung. A small, stable left apical pneumothorax is seen. A stable area of lucency is seen along the right heart border. The  cardiac silhouette is moderately enlarged and unchanged in size. Degenerative changes seen within the mid and lower thoracic spine. IMPRESSION: 1. Interval nasogastric tube placement with its distal end extending into the body of the stomach. 2. No additional significant interval changes when compared to the prior exam. Electronically Signed   By: Virgina Norfolk M.D.   On: 06/14/2019 23:50   DG CHEST PORT 1 VIEW  Result Date: 06/14/2019 CLINICAL DATA:  Evaluate for pneumothorax. EXAM: PORTABLE CHEST 1 VIEW COMPARISON:  Jun 14, 2019 (6:42 p.m.) FINDINGS: There is stable endotracheal tube positioning. Mild to moderate severity, stable areas of atelectasis and/or infiltrate are seen within the bilateral lung bases. There is no evidence of a pleural effusion. A stable left-sided pneumothorax is seen with a stable lucency seen abutting the right heart border. The pleural line seen along the right apex on the prior study is not clearly identified on the current exam. The cardiac silhouette is moderately enlarged and unchanged in size. The visualized skeletal structures are unremarkable. IMPRESSION: No significant interval change when compared to the prior chest plain film dated Jun 14, 2019 (6:42 p.m.). Electronically Signed   By: Virgina Norfolk M.D.   On: 06/14/2019 21:36   DG CHEST PORT 1 VIEW  Result Date: 06/14/2019 CLINICAL DATA:  Intubation. EXAM: PORTABLE CHEST 1 VIEW COMPARISON:  Radiograph yesterday. FINDINGS: Endotracheal  tube tip is 2.3 cm from the carina. Enteric tube is no longer visualized. There are new bilateral pneumothoraces, moderate in size. Lucency abutting the right heart border is likely visualization of an anterior pneumothorax. There is subcutaneous emphysema in the left chest wall. Cardiomegaly. Patchy bibasilar opacities. No obvious rib fracture. IMPRESSION: 1. New bilateral pneumothoraces, moderate in size. 2. Subcutaneous emphysema in the left chest wall. 3. Endotracheal tube tip  2.3 cm from the carina. 4. Patchy bibasilar opacities, may represent atelectasis, aspiration or pneumonia. 5. Cardiomegaly. Critical Value/emergent results were called by telephone at the time of interpretation on 06/14/2019 at 7:10 pm to Dr Lucio Edward, who verbally acknowledged these results. Electronically Signed   By: Keith Rake M.D.   On: 06/14/2019 19:10   DG Chest Portable 1 View  Result Date: 06/13/2019 CLINICAL DATA:  Shortness of breath, post intubation EXAM: PORTABLE CHEST 1 VIEW COMPARISON:  None. FINDINGS: Endotracheal tube terminates 2 cm above the carina. Increased interstitial markings with lower lobe predominant opacities. This may reflect mild interstitial edema, although superimposed infection or aspiration is also possible given the lower lobe predominance. No definite pleural effusions. The heart is normal in size. Enteric tube terminates in the distal esophagus with side port in the mid esophagus. IMPRESSION: Endotracheal tube terminates 2 cm above the carina. Enteric tube terminates in the distal esophagus with side port in the mid esophagus. Advancement is suggested. Multifocal pulmonary opacities, possibly reflecting interstitial edema, although lower lobe predominance raises the possibility of superimposed infection/aspiration. Electronically Signed   By: Julian Hy M.D.   On: 06/13/2019 05:43   DG Abd Portable 1V  Result Date: 06/18/2019 CLINICAL DATA:  NG tube placement. EXAM: PORTABLE ABDOMEN - 1 VIEW COMPARISON:  06/14/2019 FINDINGS: The NG tube tip is in the body/antral region of the stomach. Right-sided chest tube is noted. The lung bases are clear. Mild gaseous distention of the colon noted. No obvious free air. IMPRESSION: NG tube tip in the body/antral region of the stomach. Electronically Signed   By: Marijo Sanes M.D.   On: 06/18/2019 15:02   DG Abd Portable 1V  Result Date: 06/14/2019 CLINICAL DATA:  Enteric catheter placement EXAM: PORTABLE ABDOMEN - 1 VIEW  COMPARISON:  06/14/2019 at 10:24 a.m. FINDINGS: Frontal view of the lower chest and upper abdomen demonstrates enteric catheter tip projecting over gastric antrum. Numerous cardiac monitor leads are noted. Bowel gas pattern is unremarkable. Hypoventilatory changes at the lung bases. IMPRESSION: 1. Enteric catheter overlying gastric antrum. Electronically Signed   By: Randa Ngo M.D.   On: 06/14/2019 22:48   DG Abd Portable 1V  Result Date: 06/14/2019 CLINICAL DATA:  Orogastric tube placement.  Hypoxia. EXAM: PORTABLE ABDOMEN - 1 VIEW COMPARISON:  Jun 13, 2019 FINDINGS: Endotracheal tube tip is 4.3 cm above the carina. Nasogastric tube tip and side port are in the proximal stomach with the side port immediately distal to the gastroesophageal junction. No pneumothorax. A portion of the lateral left base is not included on this study. There is mild right perihilar atelectasis. Visualized lungs otherwise are clear. Heart is borderline enlarged with pulmonary vascularity normal. No adenopathy. No bone lesions. IMPRESSION: Tube positions as described. No pneumothorax. Note that a portion of the lateral left base is not seen. There is right midlung atelectasis. Visualized lungs otherwise clear. Stable cardiac prominence. Electronically Signed   By: Lowella Grip III M.D.   On: 06/14/2019 10:35   CT Angio Chest/Abd/Pel for Dissection W and/or Wo Contrast  Result  Date: 06/13/2019 CLINICAL DATA:  Unresponsive, intubated, widened mediastinum EXAM: CT ANGIOGRAPHY CHEST, ABDOMEN AND PELVIS TECHNIQUE: Non-contrast CT of the chest was initially obtained. Multidetector CT imaging through the chest, abdomen and pelvis was performed using the standard protocol during bolus administration of intravenous contrast. Multiplanar reconstructed images and MIPs were obtained and reviewed to evaluate the vascular anatomy. CONTRAST:  139mL OMNIPAQUE IOHEXOL 350 MG/ML SOLN COMPARISON:  Chest radiograph dated 06/13/2019 FINDINGS:  CTA CHEST FINDINGS Cardiovascular: On unenhanced CT, there is no evidence of intramural hematoma. Preferential opacification of the thoracic aorta. No evidence of thoracic aortic aneurysm or dissection. The heart is top-normal in size.  No pericardial effusion. Mediastinum/Nodes: No suspicious mediastinal lymphadenopathy. Enteric tube terminates in the distal esophagus (series 6/image 109). Visualized thyroid is unremarkable. Lungs/Pleura: Endotracheal tube terminates 2 cm above the carina. Multifocal patchy opacities in the posterior upper lobes, right middle lobe, and bilateral lower lobes, lower lobe predominant. This appearance suggests multifocal pneumonia, possibly on the basis of aspiration. Evaluation of the lung parenchyma is constrained by respiratory motion. Within that constraint, there are no suspicious pulmonary nodules. No pleural effusion or pneumothorax. Musculoskeletal: Visualized osseous structures are within normal limits. Review of the MIP images confirms the above findings. CTA ABDOMEN AND PELVIS FINDINGS VASCULAR Aorta: No evidence of abdominal aortic aneurysm or dissection. Patent. Celiac: Patent. SMA: Patent. Renals: Patent bilaterally. IMA: Patent. Inflow: Patent. Veins: Unremarkable. Review of the MIP images confirms the above findings. NON-VASCULAR Hepatobiliary: Liver is within normal limits. Gallbladder is unremarkable. No intrahepatic or extrahepatic ductal dilatation. Pancreas: Within normal limits. Spleen: Within normal limits. Adrenals/Urinary Tract: Adrenal glands are within normal limits. Bilateral renal sinus cysts. Kidneys otherwise within normal limits. No hydronephrosis. Bladder is low lying and decompressed by an indwelling Foley catheter. Stomach/Bowel: Stomach is within normal limits. No evidence of bowel obstruction. Appendix is not discretely visualized. Lymphatic: No suspicious abdominopelvic lymphadenopathy. Reproductive: Status post prostatectomy. Other: No  abdominopelvic ascites. Tiny fat containing left inguinal hernia (series 6/image 292). Small fat containing periumbilical hernia (series 6/image 219). Musculoskeletal: Mild degenerative changes of the lumbar spine. Review of the MIP images confirms the above findings. IMPRESSION: No evidence of thoracoabdominal aortic aneurysm or dissection. Multifocal pneumonia, lower lobe predominant, possibly on the basis of aspiration. Endotracheal tube terminates 2 cm above the carina. Enteric tube terminates in the distal esophagus. Advancement is suggested. Additional ancillary findings as above. Electronically Signed   By: Julian Hy M.D.   On: 06/13/2019 06:27    Microbiology: Recent Results (from the past 240 hour(s))  SARS Coronavirus 2 by RT PCR (hospital order, performed in [facility] hospital lab) Nasopharyngeal Nasopharyngeal Swab     Status: None   Collection Time: 06/13/19  4:36 AM   Specimen: Nasopharyngeal Swab  Result Value Ref Range Status   SARS Coronavirus 2 NEGATIVE NEGATIVE Final    Comment: (NOTE) SARS-CoV-2 target nucleic acids are NOT DETECTED. The SARS-CoV-2 RNA is generally detectable in upper and lower respiratory specimens during the acute phase of infection. The lowest concentration of SARS-CoV-2 viral copies this assay can detect is 250 copies / mL. A negative result does not preclude SARS-CoV-2 infection and should not be used as the sole basis for treatment or other patient management decisions.  A negative result may occur with improper specimen collection / handling, submission of specimen other than nasopharyngeal swab, presence of viral mutation(s) within the areas targeted by this assay, and inadequate number of viral copies (<250 copies / mL). A negative result  must be combined with clinical observations, patient history, and epidemiological information. Fact Sheet for Patients:   StrictlyIdeas.no Fact Sheet for Healthcare  Providers: BankingDealers.co.za This test is not yet approved or cleared  by the [place] FDA and has been authorized for detection and/or diagnosis of SARS-CoV-2 by FDA under an Emergency Use Authorization (EUA).  This EUA will remain in effect (meaning this test can be used) for the duration of the COVID-19 declaration under Section 564(b)(1) of the Act, 21 U.S.C. section 360bbb-3(b)(1), unless the authorization is terminated or revoked sooner. Performed at [facility], [place] [address] [place], [place] [address]   Blood culture (routine x 2)     Status: None   Collection Time: 06/13/19  4:40 AM   Specimen: BLOOD  Result Value Ref Range Status   Specimen Description BLOOD RIGHT IV START  Final   Special Requests   Final    BOTTLES DRAWN AEROBIC AND ANAEROBIC Blood Culture results may not be optimal due to an inadequate volume of blood received in culture bottles   Culture   Final    NO GROWTH 5 DAYS Performed at [facility], [place] [address] [place], [place] [address]    Report Status 06/18/2019 FINAL  Final  Blood culture (routine x 2)     Status: None   Collection Time: 06/13/19  4:40 AM   Specimen: BLOOD  Result Value Ref Range Status   Specimen Description BLOOD RIGHT IV START  Final   Special Requests   Final    BOTTLES DRAWN AEROBIC AND ANAEROBIC Blood Culture results may not be optimal due to an inadequate volume of blood received in culture bottles   Culture   Final    NO GROWTH 5 DAYS Performed at [facility], [place] [address] [place], [place] [address]    Report Status 06/18/2019 FINAL  Final  Urine culture     Status: None   Collection Time: 06/13/19  6:45 AM   Specimen: In/Out Cath Urine  Result Value Ref Range Status   Specimen Description IN/OUT CATH URINE  Final   Special Requests NONE  Final   Culture   Final    NO GROWTH Performed at [facility], [place] [address] [place], [place] [address]    Report  Status 06/14/2019 FINAL  Final  MRSA PCR Screening     Status: None   Collection Time: 06/13/19  8:54 AM   Specimen: Nasopharyngeal  Result Value Ref Range Status   MRSA by PCR NEGATIVE NEGATIVE Final    Comment:        The GeneXpert MRSA Assay (FDA approved for NASAL specimens only), is one component of a comprehensive MRSA colonization surveillance program. It is not intended to diagnose MRSA infection nor to guide or monitor treatment for MRSA infections. Performed at [facility], [place] [address], [place], [place] [address]   Culture, respiratory (non-expectorated)     Status: None   Collection Time: 06/14/19 11:44 AM   Specimen: Tracheal Aspirate; Respiratory  Result Value Ref Range Status   Specimen Description TRACHEAL ASPIRATE  Final   Special Requests NONE  Final   Gram Stain   Final    MODERATE WBC PRESENT, PREDOMINANTLY PMN RARE GRAM POSITIVE RODS    Culture   Final    RARE Consistent with normal respiratory flora. Performed at [facility], [place] [address] [place], [place] [address]    Report Status 06/16/2019  FINAL  Final     Labs: Basic Metabolic Panel: Recent Labs  Lab 06/16/19 0256 06/17/19 0500 06/18/19 0248 06/20/19 0153 06/21/19 0216  NA 141 147* 146* 146* 145  K 4.0 3.9 3.7 4.0 4.6  CL 108 110 112* 109 108  CO2 24 26 27 30 31   GLUCOSE 187* 141* 137* 111* 98  BUN 30* 40* 39* 41* 38*  CREATININE 1.09 1.06 0.95 1.07 1.04  CALCIUM 8.7* 9.3 9.2 8.9 8.9  MG  --   --   --  2.3 2.1  PHOS  --   --   --  3.3 1.8*   Liver Function Tests: No results for input(s): AST, ALT, ALKPHOS, BILITOT, PROT, ALBUMIN in the last 168 hours. No results for input(s): LIPASE, AMYLASE in the last 168 hours. No results for input(s): AMMONIA in the last 168 hours. CBC: Recent Labs  Lab 06/18/19 0248 06/20/19 0153 06/21/19 0216  WBC 7.0 7.4 8.1  HGB 12.4* 12.2* 14.5  HCT 37.1* 37.6* 44.7  MCV 92.3 94.9 94.5  PLT 189 203 216   Cardiac Enzymes: No  results for input(s): CKTOTAL, CKMB, CKMBINDEX, TROPONINI in the last 168 hours. BNP: BNP (last 3 results) No results for input(s): BNP in the last 8760 hours.  ProBNP (last 3 results) No results for input(s): PROBNP in the last 8760 hours.  CBG: Recent Labs  Lab 06/21/19 1700 06/21/19 2003 06/21/19 2334 06/22/19 0331 06/22/19 0751  GLUCAP 134* 151* 120* 112* 147*       Signed:  Sharol Croghan  [facility] 06/22/2019, 8:57 AM

## 2019-06-22 NOTE — Plan of Care (Signed)

## 2019-06-22 NOTE — Care Management Important Message (Signed)
Important Message  Patient Details  Name: Chase Guzman MRN: [id number] Date of Birth: 02-16-1945   Medicare Important Message Given:  Other (see comment)  Patient left prior to discharge delivery.  Mail to the the patient home address.    Fabricio Endsley 06/22/2019, 11:45 AM

## 2019-06-22 NOTE — Discharge Instructions (Signed)
Acute Respiratory Failure, Adult  Acute respiratory failure occurs when there is not enough oxygen passing from your lungs to your body. When this happens, your lungs have trouble removing carbon dioxide from the blood. This causes your blood oxygen level to drop too low as carbon dioxide builds up. Acute respiratory failure is a medical emergency. It can develop quickly, but it is temporary if treated promptly. Your lung capacity, or how much air your lungs can hold, may improve with time, exercise, and treatment. What are the causes? There are many possible causes of acute respiratory failure, including:  Lung injury.  Chest injury or damage to the ribs or tissues near the lungs.  Lung conditions that affect the flow of air and blood into and out of the lungs, such as pneumonia, acute respiratory distress syndrome, and cystic fibrosis.  Medical conditions, such as strokes or spinal cord injuries, that affect the muscles and nerves that control breathing.  Blood infection (sepsis).  Inflammation of the pancreas (pancreatitis).  A blood clot in the lungs (pulmonary embolism).  A large-volume blood transfusion.  Burns.  Near-drowning.  Seizure.  Smoke inhalation.  Reaction to medicines.  Alcohol or drug overdose. What increases the risk? This condition is more likely to develop in people who have:  A blocked airway.  Asthma.  A condition or disease that damages or weakens the muscles, nerves, bones, or tissues that are involved in breathing.  A serious infection.  A health problem that blocks the unconscious reflex that is involved in breathing, such as hypothyroidism or sleep apnea.  A lung injury or trauma. What are the signs or symptoms? Trouble breathing is the main symptom of acute respiratory failure. Symptoms may also include:  Rapid breathing.  Restlessness or anxiety.  Skin, lips, or fingernails that appear blue (cyanosis).  Rapid heart  rate.  Abnormal heart rhythms (arrhythmias).  Confusion or changes in behavior.  Tiredness or loss of energy.  Feeling sleepy or having a loss of consciousness. How is this diagnosed? Your health care provider can diagnose acute respiratory failure with a medical history and physical exam. During the exam, your health care provider will listen to your heart and check for crackling or wheezing sounds in your lungs. Your may also have tests to confirm the diagnosis and determine what is causing respiratory failure. These tests may include:  Measuring the amount of oxygen in your blood (pulse oximetry). The measurement comes from a small device that is placed on your finger, earlobe, or toe.  Other blood tests to measure blood gases and to look for signs of infection.  Sampling your cerebral spinal fluid or tracheal fluid to check for infections.  Chest X-ray to look for fluid in spaces that should be filled with air.  Electrocardiogram (ECG) to look at the heart's electrical activity. How is this treated? Treatment for this condition usually takes places in a hospital intensive care unit (ICU). Treatment depends on what is causing the condition. It may include one or more treatments until your symptoms improve. Treatment may include:  Supplemental oxygen. Extra oxygen is given through a tube in the nose, a face mask, or a hood.  A device such as a continuous positive airway pressure (CPAP) or bi-level positive airway pressure (BiPAP or BPAP) machine. This treatment uses mild air pressure to keep the airways open. A mask or other device will be placed over your nose or mouth. A tube that is connected to a motor will deliver oxygen through the   mask.  Ventilator. This treatment helps move air into and out of the lungs. This may be done with a bag and mask or a machine. For this treatment, a tube is placed in your windpipe (trachea) so air and oxygen can flow to the lungs.  Extracorporeal  membrane oxygenation (ECMO). This treatment temporarily takes over the function of the heart and lungs, supplying oxygen and removing carbon dioxide. ECMO gives the lungs a chance to recover. It may be used if a ventilator is not effective.  Tracheostomy. This is a procedure that creates a hole in the neck to insert a breathing tube.  Receiving fluids and medicines.  Rocking the bed to help breathing. Follow these instructions at home:  Take over-the-counter and prescription medicines only as told by your health care provider.  Return to normal activities as told by your health care provider. Ask your health care provider what activities are safe for you.  Keep all follow-up visits as told by your health care provider. This is important. How is this prevented? Treating infections and medical conditions that may lead to acute respiratory failure can help prevent the condition from developing. Contact a health care provider if:  You have a fever.  Your symptoms do not improve or they get worse. Get help right away if:  You are having trouble breathing.  You lose consciousness.  Your have cyanosis or turn blue.  You develop a rapid heart rate.  You are confused. These symptoms may represent a serious problem that is an emergency. Do not wait to see if the symptoms will go away. Get medical help right away. Call your local emergency services (911 in the [place] Do not drive yourself to the hospital. This information is not intended to replace advice given to you by your health care provider. Make sure you discuss any questions you have with your health care provider. Document Revised: 12/20/2016 Document Reviewed: 07/26/2015 Elsevier Patient Education  [place]. Aspiration Pneumonia Aspiration pneumonia is an infection in the lungs. It occurs when saliva or liquid contaminated with bacteria is inhaled (aspirated) into the lungs. When these things get into the lungs, swelling  (inflammation) and infection can occur. This can make it difficult to breathe. Aspiration pneumonia is a serious condition and can be life threatening. What are the causes? This condition is caused when saliva or liquid from the mouth, throat, or stomach is inhaled into the lungs, and when those fluids are contaminated with bacteria. What increases the risk? The following factors may make you more likely to develop this condition:  A narrowing of the tube that carries food to the stomach (esophageal narrowing).  Having gastroesophageal reflux disease (GERD).  Having a weak immune system.  Having diabetes.  Having poor oral hygiene.  Being malnourished. The condition is more likely to occur when a person's cough (gag) reflex, or ability to swallow, has decreased. Some things that can cause this decrease include:  Having a brain injury or disease, such as stroke, seizures, Parkinson disease, dementia, or amyotrophic lateral sclerosis (ALS).  Being given a general anesthetic for procedures.  Drinking too much alcohol. If a person passes out and vomits, vomit can be inhaled into the lungs.  Taking certain medicines, such as tranquilizers or sedatives. What are the signs or symptoms? Symptoms of this condition include:  Fever.  A cough with secretions that are yellow, tan, or green.  Breathing problems, such as wheezing or shortness of breath.  Chest pain.  Being more  tired than usual (fatigue).  Having a history of coughing while eating or drinking.  Bad breath.  Bluish color to the lips, skin, or fingers. How is this diagnosed? This condition may be diagnosed based on:  A physical exam.  Tests, such as: ? Chest X-ray. ? Sputum culture. Saliva and mucus (sputum) are collected from the lungs or the tubes that carry air to the lungs (bronchi). The sputum is then tested for bacteria. ? Oximetry. A sensor or clip is placed on areas such as a finger, earlobe, or toe to  measure the oxygen level in your blood. ? Blood tests. ? Swallowing study. This test looks at how food is swallowed and whether it goes into your breathing tube (trachea) or esophagus. ? Bronchoscopy. This test uses a flexible tube (bronchoscope) to see inside the lungs. How is this treated? This condition may be treated with:  Medicines. Antibiotic medicine will be given to kill the pneumonia bacteria. Other medicines may also be used to reduce fever or pain.  Breathing assistance and oxygen therapy. Depending on how well you are breathing, you may need to be given oxygen, or you may need breathing support from a breathing machine (ventilator).  Thoracentesis. This is a procedure to remove fluid that has built up in the space between the linings of the chest wall and the lungs.  Feeding tube and diet change. For people who have difficulty swallowing, a feeding tube might be placed in the stomach, or they may be asked to avoid certain food textures or liquids when eating. Follow these instructions at home: Medicines  Take over-the-counter and prescription medicines only as told by your health care provider. ? If you were prescribed an antibiotic medicine, take it as told by your health care provider. Do not stop taking the antibiotic even if you start to feel better. ? Take cough medicine only if you are losing sleep. Cough medicine can prevent your body's natural ability to remove mucus from your lungs. General instructions  Carefully follow any eating instructions you were given, such as avoiding certain food textures or thickening your liquids. Thickening liquids reduces the risk of developing aspiration pneumonia again.  Use breathing exercises such as postural drainage, deep breathing, and incentive spirometry to help expel secretions.  Rest as instructed by your health care provider.  Sleep in a semi-upright position at night. Try to sleep in a reclining chair, or place a few  pillows under your head.  Do not use any products that contain nicotine or tobacco, such as cigarettes and e-cigarettes. If you need help quitting, ask your health care provider.  Keep all follow-up visits as told by your health care provider. This is important. Contact a health care provider if:  You have a fever.  You have a worsening cough with yellow, tan, or green secretions.  You have coughing while eating or drinking. Get help right away if:  You have worsening shortness of breath, wheezing, or difficulty breathing.  You have chest pain. Summary  Aspiration pneumonia is an infection in the lungs. It is caused when saliva or liquid from the mouth, throat, or stomach is inhaled into the lungs.  Aspiration pneumonia is more likely to occur when a person's cough reflex or ability to swallow has decreased.  Symptoms of aspiration pneumonia include coughing, breathing problems, fever, and chest pain.  Aspiration pneumonia may be treated with antibiotic medicine, other medicines to reduce pain or fever, and breathing assistance or oxygen therapy. This information is  not intended to replace advice given to you by your health care provider. Make sure you discuss any questions you have with your health care provider. Document Revised: 12/20/2016 Document Reviewed: 02/13/2016 Elsevier Patient Education  [place].   Chest Tube Insertion, Adult, Care After This sheet gives you information about how to care for yourself after your procedure. Your health care provider may also give you more specific instructions. If you have problems or questions, contact your health care provider. What can I expect after the procedure? After the procedure, it is common to have:  Mild discomfort.  Slight bruising. Follow these instructions at home: Incision care   Follow instructions from your health care provider about how to take care of your incision. Make sure you: ? Wash your hands  with soap and water before you change your bandage (dressing). If soap and water are not available, use hand sanitizer. ? Change your dressing as told by your health care provider. ? Leave stitches (sutures), skin glue, or adhesive strips in place. These skin closures may need to stay in place for 2 weeks or longer. If adhesive strip edges start to loosen and curl up, you may trim the loose edges. Do not remove adhesive strips completely unless your health care provider tells you to do that.  Check your incision area every day for signs of infection. Check for: ? More redness, swelling, or pain. ? More fluid or blood. ? Warmth. ? Pus or a bad smell.  Do not take baths, swim, or use a hot tub until your health care provider approves. Ask your health care provider if you can take showers or have a sponge bath. You may need to keep the incision area dry for a few days. General instructions  Take over-the-counter and prescription medicines only as told by your health care provider.  Return to your normal activities as told by your health care provider. Ask your health care provider what activities are safe.  Keep all follow-up visits as told by your health care provider. This is important.  Do not drive for 24 hours if you were given a medicine to help you relax (sedative). Contact a health care provider if:  You have chills or fever.  You have pain that is not controlled with medicine.  You have more redness, swelling, or pain around your incision.  You have more fluid or blood coming from your incision.  Your incision feels warm to the touch.  You have pus or a bad smell coming from the incision. Get help right away if:  You have chest pain or trouble breathing.  You develop red streaking on your skin around your incision. This information is not intended to replace advice given to you by your health care provider. Make sure you discuss any questions you have with your health care  provider. Document Revised: 12/20/2016 Document Reviewed: 06/21/2015 Elsevier Patient Education  2020 [employer].

## 2019-06-23 DIAGNOSIS — J690 Pneumonitis due to inhalation of food and vomit: Secondary | ICD-10-CM | POA: Diagnosis not present

## 2019-06-23 DIAGNOSIS — K219 Gastro-esophageal reflux disease without esophagitis: Secondary | ICD-10-CM | POA: Diagnosis not present

## 2019-06-23 DIAGNOSIS — J9691 Respiratory failure, unspecified with hypoxia: Secondary | ICD-10-CM | POA: Diagnosis not present

## 2019-06-23 DIAGNOSIS — J398 Other specified diseases of upper respiratory tract: Secondary | ICD-10-CM | POA: Diagnosis not present

## 2019-06-29 ENCOUNTER — Encounter: Payer: Self-pay | Admitting: Dermatology

## 2019-06-29 ENCOUNTER — Ambulatory Visit: Payer: BLUE CROSS/BLUE SHIELD | Admitting: Dermatology

## 2019-06-29 ENCOUNTER — Other Ambulatory Visit: Payer: Self-pay

## 2019-06-29 DIAGNOSIS — R21 Rash and other nonspecific skin eruption: Secondary | ICD-10-CM

## 2019-06-29 DIAGNOSIS — B353 Tinea pedis: Secondary | ICD-10-CM | POA: Diagnosis not present

## 2019-06-29 LAB — POCT SKIN KOH: Skin KOH, POC: POSITIVE — AB

## 2019-06-29 NOTE — Patient Instructions (Signed)
Follow-up for [facility] who has had a particularly rough time in recent weeks including respiratory arrest, CODE BLUE, subglottal stenosis, prednisone therapy, ICU stay.  He has a longstanding rash on his lower legs which has been variably treated with topical steroids and antifungals.  Examination showed 4 x 5 inch papulosquamous plaques; scraping from the edges was loaded with fungus, the source almost certainly being his feet and toenails.  Because the possibility of deep involvement (Majocchi's granuloma), there would be a strong preference to use oral antifungals but I will first check for the safety with his other medications and possible upcoming general anesthesia for tracheal dilatation.  If the family does not call me tomorrow and I will try and reach them tomorrow night.

## 2019-06-30 NOTE — Progress Notes (Addendum)
   New Patient   Subjective  Chase Guzman is a 74 y.o. male who presents for the following: New Patient (Initial Visit) (Patient here today for rash on both legs x years and now he has round dots on the legs that developed after patient got out of ICU on 06/22/2019.  Patient had previously tried and failed Lortimin Spray, Cortaid 10 Ointment, Mometasone Furoate(Dr,. Melinda Crutch), and Lamisil Cream no improvement. No one in home has rash, no pets in home, no household change. Rash is itching, waking patient up at night, no bleeding.).  rash Location: legs Duration:  Quality: getting worse Associated Signs/Symptoms: Modifying Factors: prednisone, topical steroids Severity:  Timing: Context:    The following portions of the chart were reviewed this encounter and updated as appropriate: Tobacco  Allergies  Meds  Problems  Med Hx  Surg Hx  Fam Hx      Objective  Well appearing patient in no apparent distress; mood and affect are within normal limits.  All skin waist up examined. Plus legs, feet, nails.   Assessment & Plan  Rash (3) Left Lower Leg - Anterior; Right Lower Leg - Anterior; Right Lower Leg - Posterior  Will check short term drug interactions with terbenifine (may require general anesthesia in near future).  POCT Skin KOH - Left Lower Leg - Anterior, Right Lower Leg - Posterior  Culture, Fungus with Smear - Left Lower Leg - Anterior, Right Lower Leg - Posterior  Tinea pedis of right foot Right Foot - Anterior  Tinea pedis of left foot Left Foot - Anterior  If Rx terbinafine, I explained to pt this will be short term to target skin and nails will not respond.   Follow-up for [name] who has had a particularly rough time in recent weeks including respiratory arrest, CODE BLUE, subglottal stenosis, prednisone therapy, ICU stay.  He has a longstanding rash on his lower legs which has been variably treated with topical steroids and antifungals.  Examination showed  4 x 5 inch papulosquamous plaques; scraping from the edges was loaded with fungus, the source almost certainly being his feet and toenails.  Because the possibility of deep involvement (Majocchi's granuloma), there would be a strong preference to use oral antifungals but I will first check for the safety with his other medications and possible upcoming general anesthesia for tracheal dilatation.  If the family does not call me tomorrow and I will try and reach them tomorrow night. I reviewed his medications and want him to take one oral terbinafine daily for three weeks (#21, NR). DrT

## 2019-07-01 ENCOUNTER — Telehealth: Payer: Self-pay | Admitting: Dermatology

## 2019-07-01 MED ORDER — TERBINAFINE HCL 250 MG PO TABS: 250.0000 mg | ORAL_TABLET | Freq: Every day | ORAL | 0 refills | Status: DC

## 2019-07-01 NOTE — Telephone Encounter (Signed)
Patient was seen on 06/29/19 and patient's wife is calling to inquire if there is an update for a possible prescription for patient's rash. Patient states their pharmacy is [facility] in [place], [place]. Patient says to call her phone at [phone] and leave a detailed message if does not answer. Chart [phone]

## 2019-07-01 NOTE — Telephone Encounter (Signed)
I reviewed his medications and want him to take one oral terbinafine 250mg  daily for three weeks (#21, NR). Dr.Tafeen

## 2019-07-02 ENCOUNTER — Telehealth: Payer: Self-pay | Admitting: *Deleted

## 2019-07-02 NOTE — Telephone Encounter (Signed)
Left message for patient to call us back.  

## 2019-07-02 NOTE — Telephone Encounter (Signed)
-----   Message from Lavonna Monarch, MD sent at 06/30/2019  8:55 PM EDT ----- I reviewed his medications and want him to take one oral terbinafine daily for three weeks (#21, NR). DrT

## 2019-07-02 NOTE — Telephone Encounter (Signed)
Results to patient. Dr Denna Haggard called in rx terbinafine and patient is notified.

## 2019-07-06 DIAGNOSIS — G4733 Obstructive sleep apnea (adult) (pediatric): Secondary | ICD-10-CM | POA: Diagnosis not present

## 2019-07-11 ENCOUNTER — Encounter: Payer: Self-pay | Admitting: Dermatology

## 2019-07-13 DIAGNOSIS — J343 Hypertrophy of nasal turbinates: Secondary | ICD-10-CM | POA: Diagnosis not present

## 2019-07-13 DIAGNOSIS — J3489 Other specified disorders of nose and nasal sinuses: Secondary | ICD-10-CM | POA: Diagnosis not present

## 2019-07-13 DIAGNOSIS — J386 Stenosis of larynx: Secondary | ICD-10-CM | POA: Diagnosis not present

## 2019-07-13 DIAGNOSIS — R490 Dysphonia: Secondary | ICD-10-CM | POA: Diagnosis not present

## 2019-07-22 DIAGNOSIS — R0602 Shortness of breath: Secondary | ICD-10-CM | POA: Diagnosis not present

## 2019-07-22 DIAGNOSIS — R05 Cough: Secondary | ICD-10-CM | POA: Diagnosis not present

## 2019-07-28 LAB — FUNGUS CULTURE W SMEAR
MICRO NUMBER:: 10565844
SPECIMEN QUALITY:: ADEQUATE

## 2019-08-04 ENCOUNTER — Ambulatory Visit: Payer: Self-pay | Admitting: Dermatology

## 2019-08-17 ENCOUNTER — Ambulatory Visit: Payer: BLUE CROSS/BLUE SHIELD | Admitting: Dermatology

## 2019-08-17 ENCOUNTER — Other Ambulatory Visit: Payer: Self-pay

## 2019-08-17 DIAGNOSIS — B353 Tinea pedis: Secondary | ICD-10-CM

## 2019-08-17 DIAGNOSIS — D1801 Hemangioma of skin and subcutaneous tissue: Secondary | ICD-10-CM

## 2019-08-17 DIAGNOSIS — I872 Venous insufficiency (chronic) (peripheral): Secondary | ICD-10-CM | POA: Diagnosis not present

## 2019-08-17 NOTE — Patient Instructions (Addendum)
Routine follow-up for Chase Guzman date of birth 1945/11/30.  There is dramatic clearing of the tinea both along the surface and down hair follicles on his leg particularly on the right side.  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  We did discuss long-term oral therapy for his toenails but the 3 of [place] will consensually agree that this is not very desirable.  He could minimize the spread from the toenails back up with either a topical antifungal like Lotrimin or by putting on his socks before undershorts and pants.  The residual swelling in his leg is mostly related to chronic venous disease and we discussed ways to minimize venous pressure.  He does have a palpable foot pulse but because of the swelling it is not strong.  He may discuss noninvasive vascular mapping in the future with his cardiologist or his internist.  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  Follow-up for his legs will be on an as-needed basis.  General skin examination annually

## 2019-08-23 DIAGNOSIS — J386 Stenosis of larynx: Secondary | ICD-10-CM | POA: Diagnosis not present

## 2019-08-25 DIAGNOSIS — R490 Dysphonia: Secondary | ICD-10-CM | POA: Diagnosis not present

## 2019-08-25 DIAGNOSIS — K219 Gastro-esophageal reflux disease without esophagitis: Secondary | ICD-10-CM | POA: Diagnosis not present

## 2019-08-25 DIAGNOSIS — R609 Edema, unspecified: Secondary | ICD-10-CM | POA: Diagnosis not present

## 2019-08-30 NOTE — Addendum Note (Signed)
Addended by: Lavonna Monarch on: 08/30/2019 07:49 AM   Modules accepted: Level of Service

## 2019-09-07 ENCOUNTER — Other Ambulatory Visit: Payer: Self-pay | Admitting: *Deleted

## 2019-09-07 ENCOUNTER — Ambulatory Visit (INDEPENDENT_AMBULATORY_CARE_PROVIDER_SITE_OTHER): Payer: BLUE CROSS/BLUE SHIELD | Admitting: Vascular Surgery

## 2019-09-07 ENCOUNTER — Ambulatory Visit (HOSPITAL_COMMUNITY)
Admission: RE | Admit: 2019-09-07 | Discharge: 2019-09-07 | Disposition: A | Discharge status: Home / Self Care | Payer: BLUE CROSS/BLUE SHIELD | Source: Ambulatory Visit | Attending: Surgery | Admitting: Surgery

## 2019-09-07 ENCOUNTER — Other Ambulatory Visit: Payer: Self-pay

## 2019-09-07 ENCOUNTER — Encounter: Payer: Self-pay | Admitting: Vascular Surgery

## 2019-09-07 DIAGNOSIS — M25562 Pain in left knee: Secondary | ICD-10-CM | POA: Diagnosis not present

## 2019-09-07 DIAGNOSIS — M7989 Other specified soft tissue disorders: Secondary | ICD-10-CM

## 2019-09-07 DIAGNOSIS — M25561 Pain in right knee: Secondary | ICD-10-CM

## 2019-09-07 DIAGNOSIS — I872 Venous insufficiency (chronic) (peripheral): Secondary | ICD-10-CM | POA: Insufficient documentation

## 2019-09-07 NOTE — Progress Notes (Signed)
Patient name: Chase Guzman MRN: [id number] DOB: [birth year] Sex: male  REASON FOR CONSULT: Progressive lower extremity edema and evaluate for bilateral lower extremity venous insufficiency  HPI: Chase Guzman is a 74 y.o. male, with history of tracheal stenosis and GERD presents for evaluation of progressive lower extremity edema and possible venous insufficiency.  Patient has had intermittent swelling to both lower extremities that has been worsening throughout this year.  Patient has had some significant medical events this year and in May 2021 presented to the ED with hypoxia after having shortness of breath at home ultimately was intubated in the ED due to hypoxic respiratory failure secondary to aspiration pneumonia and progressive subglottic stenosis.  He then had a cardiac arrest due to mucous plugging of his ET tube this next day and got a short course of CPR and ultimately required bilateral chest tubes.  He has since been discharged and is back to working.  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  He states that he has noticed progressive edema since his hospitalization.  He is not wearing any compression.  Also has some skin thickening bilaterally.  No history of DVT.  No other localized trauma.  Does feel the left leg is slightly worse.  Past Medical History:  Diagnosis Date  . Angio-edema   . Eczema     Past Surgical History:  Procedure Laterality Date  . PROSTATECTOMY N/A 2007    Family History  Problem Relation Age of Onset  . Asthma Father     SOCIAL HISTORY: Social History   Socioeconomic History  . Marital status: Married    Spouse name: Not on file  . Number of children: Not on file  . Years of education: Not on file  . Highest education level: Not on file  Occupational History  . Not on file  Tobacco Use  . Smoking status: Never Smoker  . Smokeless tobacco: Never Used  Vaping Use  . Vaping Use:  Never used  Substance and Sexual Activity  . Alcohol use: Yes    Comment: occasional   . Drug use: Never  . Sexual activity: Not on file  Other Topics Concern  . Not on file  Social History Narrative  . Not on file   Social Determinants of Health   Financial Resource Strain:   . Difficulty of Paying Living Expenses:   Food Insecurity:   . Worried About Charity fundraiser in the Last Year:   . Arboriculturist in the Last Year:   Transportation Needs:   . Film/video editor (Medical):   Marland Kitchen Lack of Transportation (Non-Medical):   Physical Activity:   . Days of Exercise per Week:   . Minutes of Exercise per Session:   Stress:   . Feeling of Stress :   Social Connections:   . Frequency of Communication with Friends and Family:   . Frequency of Social Gatherings with Friends and Family:   . Attends Religious Services:   . Active Member of Clubs or Organizations:   . Attends Archivist Meetings:   Marland Kitchen Marital Status:   Intimate Partner Violence:   . Fear of Current or Ex-Partner:   . Emotionally Abused:   Marland Kitchen Physically Abused:   . Sexually Abused:     Allergies  Allergen Reactions  . Amoxicillin Rash  . Penicillins Rash    Current Outpatient Medications  Medication Sig Dispense  Refill  . dextromethorphan-guaiFENesin (MUCINEX DM) 30-600 MG 12hr tablet Take 1 tablet by mouth 2 (two) times daily.    Marland Kitchen levothyroxine (SYNTHROID, LEVOTHROID) 75 MCG tablet Take 75 mcg by mouth daily before breakfast.    . pantoprazole (PROTONIX) 40 MG tablet Take 40 mg by mouth daily.    Marland Kitchen VITAMIN D PO Take 1 tablet by mouth daily.     No current facility-administered medications for this visit.    REVIEW OF SYSTEMS:  [X]  denotes positive finding, [ ]  denotes negative finding Cardiac  Comments:  Chest pain or chest pressure:    Shortness of breath upon exertion:    Short of breath when lying flat:    Irregular heart rhythm:        Vascular    Pain in calf, thigh, or hip  brought on by ambulation:    Pain in feet at night that wakes you up from your sleep:     Blood clot in your veins:    Leg swelling:  x       Pulmonary    Oxygen at home:    Productive cough:     Wheezing:         Neurologic    Sudden weakness in arms or legs:     Sudden numbness in arms or legs:     Sudden onset of difficulty speaking or slurred speech:    Temporary loss of vision in one eye:     Problems with dizziness:         Gastrointestinal    Blood in stool:     Vomited blood:         Genitourinary    Burning when urinating:     Blood in urine:        Psychiatric    Major depression:         Hematologic    Bleeding problems:    Problems with blood clotting too easily:        Skin    Rashes or ulcers:        Constitutional    Fever or chills:      PHYSICAL EXAM: Vitals:   09/07/19 1149  BP: (!) 152/85  Pulse: 61  Resp: 18  Temp: (!) 97.5 F (36.4 C)  TempSrc: Temporal  SpO2: 99%  Weight: 219 lb (99.3 kg)  Height: 5\' 9"  (1.753 m)    GENERAL: The patient is a well-nourished male, in no acute distress. The vital signs are documented above. CARDIAC: There is a regular rate and rhythm.  VASCULAR:  No overt lower extremity varicosities evident Significant lower extremity edema left greater than right at the ankles Does have skin thickening bilaterally, no open ulcerations PULMONARY: There is good air exchange bilaterally without wheezing or rales. ABDOMEN: Soft and non-tender with normal pitched bowel sounds.  MUSCULOSKELETAL: There are no major deformities or cyanosis. NEUROLOGIC: No focal weakness or paresthesias are detected. SKIN: There are no ulcers or rashes noted. PSYCHIATRIC: The patient has a normal affect.  DATA:   Lower extremity venous reflux study shows pathologic reflux in the deep system in the right common femoral vein as well as throughout the right great saphenous vein including SFJ.  Also has significant pathologic reflux in the  left common femoral femoral vein as well as the great saphenous vein throughout the thigh including left SFJ.  Assessment/Plan:  74 year old male presents with chronic venous insufficiency of the bilateral lower extremities worsening since hospitalization for hypoxic respiratory  failure from subglottic/tracheal stenosis and subsequent CPR when his ET tube had a mucous plug earlier this year.  Reviewed the reflux study with him and noted that he has pathologic reflux in both the superficial and deep systems bilaterally.  Currently CEAP classification C3 given significant lower extremity edema with no active or prior ulcerations.  Ultimately have recommended that we size him for thigh-high compression with 20 to 30 mmHg to wear daily with leg elevation etc.  I will bring him back in 3 months to see Dr. Scot Dock or Dr. Oneida Alar to see if he may be a candidate for saphenous vein ablation.  Does have reflux at the [facility] bilaterally with fairly long segment superficial reflux although the GSV is smaller more distal in the leg.  He knows to call with questions or concerns.     Marty Heck, MD [facility] Office: [phone]

## 2019-09-25 ENCOUNTER — Encounter: Payer: Self-pay | Admitting: Dermatology

## 2019-09-25 NOTE — Progress Notes (Signed)
   Follow-Up Visit   Subjective  Chase Guzman is a 74 y.o. male who presents for the following: Follow-up (Pt stated--the calf looking much better).  Tinea Location: Right leg Duration:  Quality: Dramatic clearing Associated Signs/Symptoms: Modifying Factors:  Severity:  Timing: Context:   Objective  Well appearing patient in no apparent distress; mood and affect are within normal limits.  A focused examination was performed including Head, neck, legs, pedal pulses, toenails.. Relevant physical exam findings are noted in the Assessment and Plan.   Assessment & Plan    Venous stasis dermatitis of right lower extremity Right Lower Leg - Anterior  Venous stasis dermatitis of left lower extremity Left Lower Leg - Anterior  Techniques to minimize lower extremity venous pressure discussed.  Tinea pedis of right foot Right Lower Leg - Posterior  We mutually elected to avoid long-term oral antifungals for his toenails.  Follow-up can be on a as needed basis.  Routine follow-up for Chase Guzman date of birth 1945-12-14.  There is dramatic clearing of the tinea both along the surface and down hair follicles on his leg particularly on the right side.  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  We did discuss long-term oral therapy for his toenails but the 3 of [place] will consensually agree that this is not very desirable.  He could minimize the spread from the toenails back up with either a topical antifungal like Lotrimin or by putting on his socks before undershorts and pants.  The residual swelling in his leg is mostly related to chronic venous disease and we discussed ways to minimize venous pressure.  He does have a palpable foot pulse but because of the swelling it is not strong.  He may discuss noninvasive vascular mapping in the future with his cardiologist or his internist.  A dark spot underneath his left nostril shows a typical  benign cherry angioma with dermoscopy and does not need watching or removal.  Follow-up for his legs will be on an as-needed basis.  General skin examination annually   I, Lavonna Monarch, MD, have reviewed all documentation for this visit.  The documentation on 09/25/19 for the exam, diagnosis, procedures, and orders are all accurate and complete.

## 2019-10-17 DIAGNOSIS — Z23 Encounter for immunization: Secondary | ICD-10-CM | POA: Diagnosis not present

## 2019-10-20 DIAGNOSIS — G4733 Obstructive sleep apnea (adult) (pediatric): Secondary | ICD-10-CM | POA: Diagnosis not present

## 2019-10-27 DIAGNOSIS — J386 Stenosis of larynx: Secondary | ICD-10-CM | POA: Diagnosis not present

## 2019-12-04 ENCOUNTER — Ambulatory Visit: Payer: BLUE CROSS/BLUE SHIELD | Attending: Internal Medicine

## 2019-12-04 DIAGNOSIS — Z23 Encounter for immunization: Secondary | ICD-10-CM

## 2019-12-04 NOTE — Progress Notes (Signed)
   Covid-19 Vaccination Clinic  Name:  Juleon Narang    MRN: [id number] DOB: 11-30-1945  12/04/2019  Mr. Shampine was observed post Covid-19 immunization for 15 minutes without incident. He was provided with Vaccine Information Sheet and instruction to access the V-Safe system.   Mr. Mcalexander was instructed to call 911 with any severe reactions post vaccine: Marland Kitchen Difficulty breathing  . Swelling of face and throat  . A fast heartbeat  . A bad rash all over body  . Dizziness and weakness

## 2019-12-08 ENCOUNTER — Ambulatory Visit: Payer: BLUE CROSS/BLUE SHIELD | Admitting: Vascular Surgery

## 2019-12-15 ENCOUNTER — Ambulatory Visit: Payer: BLUE CROSS/BLUE SHIELD | Admitting: Vascular Surgery

## 2019-12-22 ENCOUNTER — Ambulatory Visit: Payer: BLUE CROSS/BLUE SHIELD | Admitting: Vascular Surgery

## 2019-12-29 ENCOUNTER — Ambulatory Visit: Payer: BLUE CROSS/BLUE SHIELD | Admitting: Vascular Surgery

## 2020-01-05 DIAGNOSIS — G4733 Obstructive sleep apnea (adult) (pediatric): Secondary | ICD-10-CM | POA: Diagnosis not present

## 2020-01-07 ENCOUNTER — Other Ambulatory Visit: Payer: Self-pay

## 2020-01-07 ENCOUNTER — Ambulatory Visit (INDEPENDENT_AMBULATORY_CARE_PROVIDER_SITE_OTHER): Payer: BLUE CROSS/BLUE SHIELD | Admitting: Vascular Surgery

## 2020-01-07 ENCOUNTER — Encounter: Payer: Self-pay | Admitting: Vascular Surgery

## 2020-01-07 VITALS — BP 146/82 | HR 56 | Temp 97.7°F | Resp 18 | Ht 69.5 in | Wt 238.7 lb

## 2020-01-07 DIAGNOSIS — I83813 Varicose veins of bilateral lower extremities with pain: Secondary | ICD-10-CM

## 2020-01-07 NOTE — Progress Notes (Signed)
74 year old male who was previously seen by my partner Dr. Carlis Abbott about 3 months ago.  His primary complaint was of lower extremity edema.  He has been wearing thigh-high compression stockings religiously and has noted improvement of his symptoms.  He usually wears the stockings 5 days a week and does not wear them on the weekends.  He also is trying to start a weight loss program and plans to lose 30 to 40 pounds.  He had several questions today regarding venous pathology and stated that as long as its not going to cause him harm he is happy with his compression stockings currently.  Currently CEAP classification C3 given significant lower extremity edema with no active or prior ulcerations.  Ultimately have recommended that we size him for thigh-high compression with 20 to 30 mmHg to wear daily with leg elevation.  Review of systems: He has no shortness of breath.  He has no chest pain.  He does have some pain in his left medial malleolus area of his ankle.  Physical exam:  Vitals:   01/07/20 1308  BP: (!) 146/82  Pulse: (!) 56  Resp: 18  Temp: 97.7 F (36.5 C)  TempSrc: Temporal  SpO2: 98%  Weight: 238 lb 11.2 oz (108.3 kg)  Height: 5' 9.5" (1.765 m)    Extremities: 2+ pulses dorsalis pedis bilaterally, 1+ edema extending from the knee to the ankle bilaterally  Data: Patient had a venous reflux exam performed September 07, 2019.  This showed reflux in the deep system of the common femoral vein.  There was also reflux in the right greater saphenous vein at the saphenofemoral junction and mid thigh.  Vein diameter was about 4 mm.  Left side had similar findings.  Assessment: Patient with symptomatic varicose veins with lower extremity edema symptoms mostly relieved by compression.  He does not wish to consider an intervention at this point and will continue with his compression stockings.  Plan: Patient will follow up on an as-needed basis.  See above  Ruta Hinds, MD [facility] [place] Office: [phone]

## 2020-01-19 DIAGNOSIS — I8393 Asymptomatic varicose veins of bilateral lower extremities: Secondary | ICD-10-CM

## 2020-01-25 DIAGNOSIS — R490 Dysphonia: Secondary | ICD-10-CM | POA: Diagnosis not present

## 2020-01-25 DIAGNOSIS — J386 Stenosis of larynx: Secondary | ICD-10-CM | POA: Diagnosis not present

## 2020-02-18 DIAGNOSIS — I8393 Asymptomatic varicose veins of bilateral lower extremities: Secondary | ICD-10-CM

## 2020-02-25 DIAGNOSIS — G4733 Obstructive sleep apnea (adult) (pediatric): Secondary | ICD-10-CM | POA: Diagnosis not present

## 2020-03-28 DIAGNOSIS — H524 Presbyopia: Secondary | ICD-10-CM | POA: Diagnosis not present

## 2020-03-28 DIAGNOSIS — H52223 Regular astigmatism, bilateral: Secondary | ICD-10-CM | POA: Diagnosis not present

## 2020-03-28 DIAGNOSIS — H40013 Open angle with borderline findings, low risk, bilateral: Secondary | ICD-10-CM | POA: Diagnosis not present

## 2020-03-28 DIAGNOSIS — H353131 Nonexudative age-related macular degeneration, bilateral, early dry stage: Secondary | ICD-10-CM | POA: Diagnosis not present

## 2020-03-28 DIAGNOSIS — H5203 Hypermetropia, bilateral: Secondary | ICD-10-CM | POA: Diagnosis not present

## 2020-03-28 DIAGNOSIS — H2513 Age-related nuclear cataract, bilateral: Secondary | ICD-10-CM | POA: Diagnosis not present

## 2020-04-14 DIAGNOSIS — I8393 Asymptomatic varicose veins of bilateral lower extremities: Secondary | ICD-10-CM

## 2020-04-20 ENCOUNTER — Ambulatory Visit: Payer: BLUE CROSS/BLUE SHIELD | Admitting: Vascular Surgery

## 2020-06-08 DIAGNOSIS — H40013 Open angle with borderline findings, low risk, bilateral: Secondary | ICD-10-CM | POA: Diagnosis not present

## 2020-06-08 DIAGNOSIS — H2513 Age-related nuclear cataract, bilateral: Secondary | ICD-10-CM | POA: Diagnosis not present

## 2020-06-08 DIAGNOSIS — H353131 Nonexudative age-related macular degeneration, bilateral, early dry stage: Secondary | ICD-10-CM | POA: Diagnosis not present

## 2020-06-22 DIAGNOSIS — J384 Edema of larynx: Secondary | ICD-10-CM | POA: Diagnosis not present

## 2020-06-22 DIAGNOSIS — R490 Dysphonia: Secondary | ICD-10-CM | POA: Diagnosis not present

## 2020-06-26 DIAGNOSIS — H40013 Open angle with borderline findings, low risk, bilateral: Secondary | ICD-10-CM | POA: Diagnosis not present

## 2020-06-26 DIAGNOSIS — H353131 Nonexudative age-related macular degeneration, bilateral, early dry stage: Secondary | ICD-10-CM | POA: Diagnosis not present

## 2020-06-26 DIAGNOSIS — H2513 Age-related nuclear cataract, bilateral: Secondary | ICD-10-CM | POA: Diagnosis not present

## 2020-07-07 DIAGNOSIS — E039 Hypothyroidism, unspecified: Secondary | ICD-10-CM | POA: Diagnosis not present

## 2020-07-07 DIAGNOSIS — E538 Deficiency of other specified B group vitamins: Secondary | ICD-10-CM | POA: Diagnosis not present

## 2020-07-07 DIAGNOSIS — E559 Vitamin D deficiency, unspecified: Secondary | ICD-10-CM | POA: Diagnosis not present

## 2020-07-07 DIAGNOSIS — Z23 Encounter for immunization: Secondary | ICD-10-CM | POA: Diagnosis not present

## 2020-07-07 DIAGNOSIS — E7800 Pure hypercholesterolemia, unspecified: Secondary | ICD-10-CM | POA: Diagnosis not present

## 2020-07-07 DIAGNOSIS — K219 Gastro-esophageal reflux disease without esophagitis: Secondary | ICD-10-CM | POA: Diagnosis not present

## 2020-07-07 DIAGNOSIS — Z0000 Encounter for general adult medical examination without abnormal findings: Secondary | ICD-10-CM | POA: Diagnosis not present

## 2020-08-16 ENCOUNTER — Ambulatory Visit: Payer: BLUE CROSS/BLUE SHIELD | Admitting: Dermatology

## 2020-09-01 ENCOUNTER — Emergency Department (HOSPITAL_COMMUNITY): Payer: BLUE CROSS/BLUE SHIELD

## 2020-09-01 ENCOUNTER — Encounter (HOSPITAL_COMMUNITY): Payer: Self-pay | Admitting: Emergency Medicine

## 2020-09-01 ENCOUNTER — Other Ambulatory Visit: Payer: Self-pay

## 2020-09-01 ENCOUNTER — Emergency Department (HOSPITAL_COMMUNITY)
Admission: EM | Admit: 2020-09-01 | Discharge: 2020-09-01 | Disposition: A | Discharge status: Home / Self Care | Payer: BLUE CROSS/BLUE SHIELD | Attending: Student | Admitting: Student

## 2020-09-01 DIAGNOSIS — W01198A Fall on same level from slipping, tripping and stumbling with subsequent striking against other object, initial encounter: Secondary | ICD-10-CM | POA: Insufficient documentation

## 2020-09-01 DIAGNOSIS — S0081XA Abrasion of other part of head, initial encounter: Secondary | ICD-10-CM | POA: Diagnosis not present

## 2020-09-01 DIAGNOSIS — W19XXXA Unspecified fall, initial encounter: Secondary | ICD-10-CM

## 2020-09-01 DIAGNOSIS — Y92094 Garage of other non-institutional residence as the place of occurrence of the external cause: Secondary | ICD-10-CM | POA: Diagnosis not present

## 2020-09-01 DIAGNOSIS — S0003XA Contusion of scalp, initial encounter: Secondary | ICD-10-CM | POA: Diagnosis not present

## 2020-09-01 DIAGNOSIS — S0990XA Unspecified injury of head, initial encounter: Secondary | ICD-10-CM | POA: Diagnosis not present

## 2020-09-01 NOTE — ED Provider Notes (Signed)
Emergency Medicine Provider Triage Evaluation Note  Chase Guzman , a 75 y.o. male  was evaluated in triage.  Pt complains of head pain and nausea after head injury.  Patient states about an hour prior to arrival he tripped and fell.  No loss of consciousness.  He is not on blood thinners.    Review of Systems  Positive: HA Negative: Neck pain  Physical Exam  BP (!) 172/102   Pulse 83   Temp 98.3 F (36.8 C) (Oral)   Resp 19   Ht '5\' 9"'$  (1.753 m)   Wt 108.9 kg   SpO2 98%   BMI 35.44 kg/m  Gen:   Awake, no distress   Resp:  Normal effort  MSK:   Moves extremities without difficulty  Other:  Hematoma of the forehead with a superficial abrasion, but no laceration.  No hemotympanum.  Medical Decision Making  Medically screening exam initiated at 9:29 PM.  Appropriate orders placed.  Penrose Rotta was informed that the remainder of the evaluation will be completed by another provider, this initial triage assessment does not replace that evaluation, and the importance of remaining in the ED until their evaluation is complete.  Ct    Franchot Heidelberg, PA-C 09/01/20 2157    Teressa Lower, MD 09/02/20 [phone]

## 2020-09-01 NOTE — ED Provider Notes (Signed)
[facility] DEPT Provider Note   CSN: [id number] Arrival date & time: 09/01/20  2058     History Chief Complaint  Patient presents with   Head Laceration    Chase Guzman is a 75 y.o. male.  75 y.o male with a PMH of Eczema, Angio-edema presents to the ED with a chief complaint of head laceration s/p mechanical fall. Patient states he was ambulating in his garage, walking around his jeep, when he suddenly tripped, landing face forward onto the edge of a cabinet.  Reports he felt like this caused a slight abrasion to the frontal aspect of his forehead.  Bleeding is controlled with pressure.  There was no other injury, no loss of consciousness, patient is currently not on any blood thinners.  He has not taken any medication for improvement in his symptoms at this time.  The history is provided by the patient and medical records.  Head Laceration This is a new problem. Pertinent negatives include no headaches.      Past Medical History:  Diagnosis Date   Angio-edema    Eczema     Patient Active Problem List   Diagnosis Date Noted   Chronic venous insufficiency 09/07/2019   Pneumothorax    Acute hypoxemic respiratory failure ([facility]) 06/13/2019   Aspiration pneumonia of both lungs due to gastric secretions ([facility])    Septic shock ([facility])    OSA on CPAP 10/28/2013   Subglottic stenosis 09/28/2013   Allergic rhinitis 04/14/2013   Deviated nasal septum 04/14/2013   Dysphonia 04/14/2013    Past Surgical History:  Procedure Laterality Date   PROSTATECTOMY N/A 2007       Family History  Problem Relation Age of Onset   Asthma Father     Social History   Tobacco Use   Smoking status: Never   Smokeless tobacco: Never  Vaping Use   Vaping Use: Never used  Substance Use Topics   Alcohol use: Yes    Comment: occasional    Drug use: Never    Home Medications Prior to Admission medications   Medication Sig Start Date End Date Taking?  Authorizing Provider  dextromethorphan-guaiFENesin (MUCINEX DM) 30-600 MG 12hr tablet Take 1 tablet by mouth 2 (two) times daily.    [provider]  levothyroxine (SYNTHROID, LEVOTHROID) 75 MCG tablet Take 75 mcg by mouth daily before breakfast.    [provider]  pantoprazole (PROTONIX) 40 MG tablet Take 40 mg by mouth daily. 06/23/19   [provider]  VITAMIN D PO Take 1 tablet by mouth daily.    [provider]    Allergies    Amoxicillin and Penicillins  Review of Systems   Review of Systems  Constitutional:  Negative for chills and fever.  Skin:  Positive for wound.  Neurological:  Negative for light-headedness and headaches.   Physical Exam Updated Vital Signs BP (!) 172/102   Pulse 83   Temp 98.3 F (36.8 C) (Oral)   Resp 19   Ht '5\' 9"'$  (1.753 m)   Wt 108.9 kg   SpO2 98%   BMI 35.44 kg/m   Physical Exam Vitals and nursing note reviewed.  Constitutional:      Appearance: Normal appearance.  HENT:     Head: Normocephalic.     Comments: 3cm linear abrasion noted to the forehead. Bleeding is controlled.     Mouth/Throat:     Mouth: Mucous membranes are moist.  Eyes:     Pupils: Pupils are  equal, round, and reactive to light.  Cardiovascular:     Rate and Rhythm: Normal rate.  Pulmonary:     Effort: Pulmonary effort is normal.     Breath sounds: No wheezing.  Abdominal:     General: Abdomen is flat.     Tenderness: There is no abdominal tenderness. There is no right CVA tenderness or left CVA tenderness.  Neurological:     Mental Status: He is alert and oriented to person, place, and time.     Comments: Alert, oriented, thought content appropriate. Speech fluent without evidence of aphasia. Able to follow 2 step commands without difficulty.  Cranial Nerves:  II:  Peripheral visual fields grossly normal, pupils, round, reactive to light III,IV, VI: ptosis not present, extra-ocular motions intact bilaterally  V,VII: smile  symmetric, facial light touch sensation equal VIII: hearing grossly normal bilaterally  IX,X: midline uvula rise  XI: bilateral shoulder shrug equal and strong XII: midline tongue extension  Motor:  5/5 in upper and lower extremities bilaterally including strong and equal grip strength and dorsiflexion/plantar flexion Sensory: light touch normal in all extremities.  Cerebellar: normal finger-to-nose with bilateral upper extremities, pronator drift negative Gait: normal gait and balance      ED Results / Procedures / Treatments   Labs (all labs ordered are listed, but only abnormal results are displayed) Labs Reviewed - No data to display  EKG None  Radiology CT HEAD WO CONTRAST (5MM)  Result Date: 09/01/2020 CLINICAL DATA:  Head laceration EXAM: CT HEAD WITHOUT CONTRAST TECHNIQUE: Contiguous axial images were obtained from the base of the skull through the vertex without intravenous contrast. COMPARISON:  None. FINDINGS: Brain: No evidence of acute infarction, hemorrhage, hydrocephalus, extra-axial collection or mass lesion/mass effect. Vascular: No hyperdense vessel or unexpected calcification. Skull: Normal. Negative for fracture or focal lesion. Sinuses/Orbits: Opacified right maxillary sinus. Other: Small forehead scalp hematoma IMPRESSION: Negative non contrasted CT appearance of the brain Electronically Signed   By: Donavan Foil M.D.   On: 09/01/2020 21:48    Procedures Procedures   Medications Ordered in ED Medications - No data to display  ED Course  I have reviewed the triage vital signs and the nursing notes.  Pertinent labs & imaging results that were available during my care of the patient were reviewed by me and considered in my medical decision making (see chart for details).    MDM Rules/Calculators/A&P   Patient presents to the ED with a chief complaint of head abrasion status post mechanical fall while at home prior to arrival.  There is a 3 cm abrasion to  his forehead.  There was no loss of conscious, he is currently not on any blood thinners.  Exam is benign without any neurological findings at this time.  He is overall well-appearing, vitals are remarkable for slight elevated blood pressure with a systolic in the [id number], heart rate is within normal limits.  CT head without contrast showed: Negative non contrasted CT appearance of the brain  These results were discussed at length with patient of his CT, I thoroughly explored her wound, there is no need for suture repair at this time.  Bleeding is controlled, he is ambulatory with a steady gait.  Patient is stable for discharge.  Return precautions discussed at length.  Portions of this note were generated with Lobbyist. Dictation errors may occur despite best attempts at proofreading.  Final Clinical Impression(s) / ED Diagnoses Final diagnoses:  Fall, initial encounter  Abrasion of  forehead, initial encounter    Rx / DC Orders ED Discharge Orders     None        Janeece Fitting, Hershal Coria 09/01/20 2221    Drenda Freeze, MD 09/01/20 2308

## 2020-09-01 NOTE — Discharge Instructions (Addendum)
We discussed the results of your CT head on today's visit.  Please keep your wound clean and dry.  May apply Neosporin to help with scarring.  While walking outdoors, please keep your wound away from the sun.  May also take Tylenol or ibuprofen to help with your symptoms.

## 2020-09-01 NOTE — ED Triage Notes (Signed)
Pt tripped in his garage and cut his forehead on a wooden shelf. Pt denies LOC.

## 2020-09-13 DIAGNOSIS — S0990XD Unspecified injury of head, subsequent encounter: Secondary | ICD-10-CM | POA: Diagnosis not present

## 2020-10-09 DIAGNOSIS — Z23 Encounter for immunization: Secondary | ICD-10-CM | POA: Diagnosis not present

## 2020-10-14 DIAGNOSIS — Z23 Encounter for immunization: Secondary | ICD-10-CM | POA: Diagnosis not present

## 2020-10-25 DIAGNOSIS — G4733 Obstructive sleep apnea (adult) (pediatric): Secondary | ICD-10-CM | POA: Diagnosis not present

## 2020-11-25 DIAGNOSIS — G4733 Obstructive sleep apnea (adult) (pediatric): Secondary | ICD-10-CM | POA: Diagnosis not present

## 2020-12-25 DIAGNOSIS — G4733 Obstructive sleep apnea (adult) (pediatric): Secondary | ICD-10-CM | POA: Diagnosis not present

## 2021-01-08 DIAGNOSIS — G4733 Obstructive sleep apnea (adult) (pediatric): Secondary | ICD-10-CM | POA: Diagnosis not present

## 2021-01-15 IMAGING — DX DG CHEST 1V PORT
1 series · 1 of 1 positions shown · non-contrast
Comparison: 06/15/2019

CLINICAL DATA: Pneumothorax.

EXAM:
PORTABLE CHEST 1 VIEW

[chest ap]
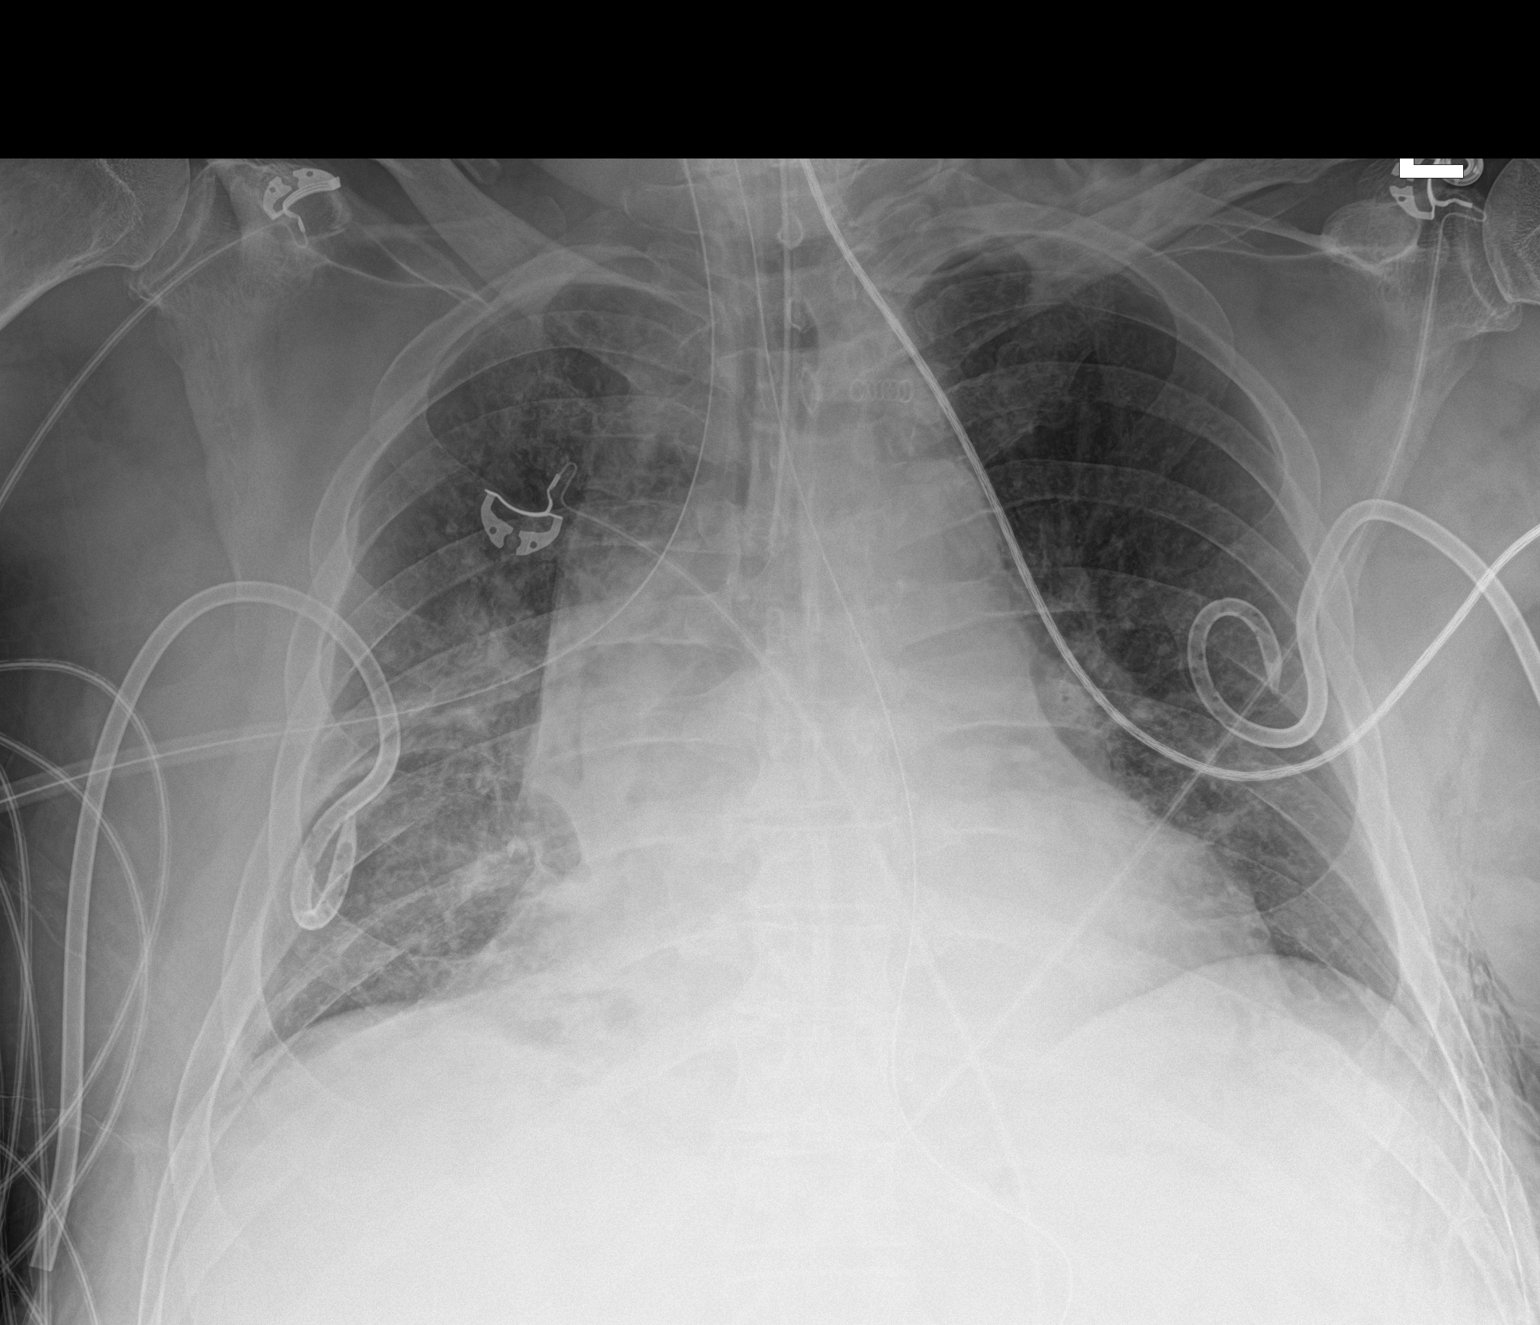

[1 of 1 positions shown; findings below may reference images not displayed]

FINDINGS: The endotracheal tube terminates 1.7 cm above the carina, slightly
lower than on the prior study. The enteric tube courses into the
upper abdomen with tip not imaged. Bilateral pigtail thoracostomy
tubes remain in place. The cardiac silhouette remains enlarged.
Right greater than left mid and lower lung opacities are unchanged.
No sizable pleural effusion or pneumothorax is identified. A small
amount of soft tissue emphysema is noted in the left lateral lower
chest wall.
IMPRESSION: 1. Unchanged right greater than left mid and lower lung airspace
opacities which may reflect atelectasis, pneumonia, or aspiration.
2. No pneumothorax identified with bilateral thoracostomy tubes in
place.

## 2021-01-17 IMAGING — DX DG ABD PORTABLE 1V
1 series · 1 of 1 positions shown · non-contrast
Comparison: 06/14/2019

CLINICAL DATA: NG tube placement.

EXAM:
PORTABLE ABDOMEN - 1 VIEW

[abdomen]
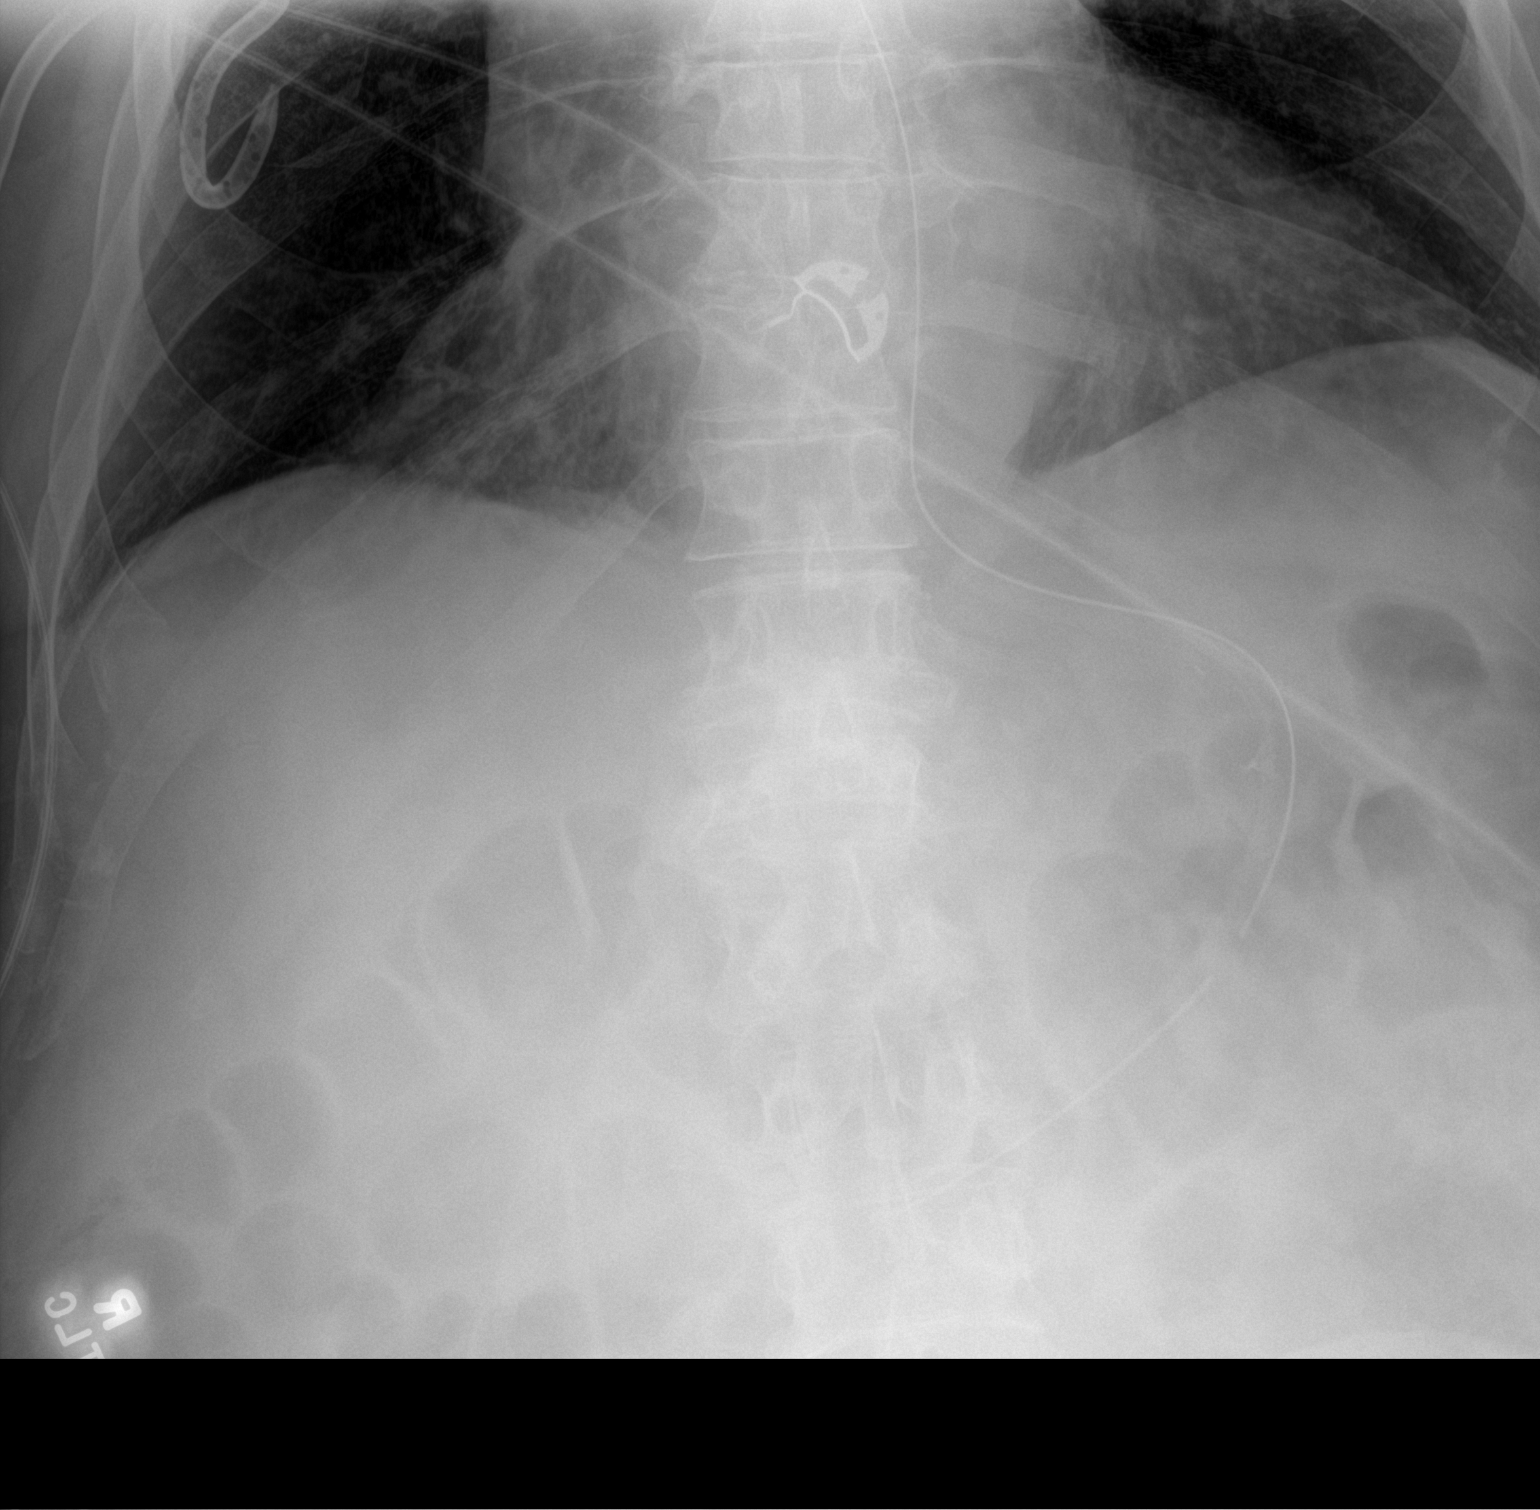

[1 of 1 positions shown; findings below may reference images not displayed]

FINDINGS: The NG tube tip is in the body/antral region of the stomach.
Right-sided chest tube is noted. The lung bases are clear. Mild
gaseous distention of the colon noted. No obvious free air.
IMPRESSION: NG tube tip in the body/antral region of the stomach.

## 2021-07-23 ENCOUNTER — Other Ambulatory Visit: Payer: Self-pay | Admitting: Family Medicine

## 2021-07-23 DIAGNOSIS — E7800 Pure hypercholesterolemia, unspecified: Secondary | ICD-10-CM

## 2021-09-28 ENCOUNTER — Ambulatory Visit
Admission: RE | Admit: 2021-09-28 | Discharge: 2021-09-28 | Disposition: A | Discharge status: Home / Self Care | Payer: No Typology Code available for payment source | Source: Ambulatory Visit | Attending: Family Medicine | Admitting: Family Medicine

## 2021-09-28 DIAGNOSIS — E7800 Pure hypercholesterolemia, unspecified: Secondary | ICD-10-CM

## 2022-08-09 ENCOUNTER — Other Ambulatory Visit: Payer: Self-pay | Admitting: Family Medicine

## 2022-08-09 DIAGNOSIS — R9389 Abnormal findings on diagnostic imaging of other specified body structures: Secondary | ICD-10-CM

## 2022-09-03 ENCOUNTER — Encounter: Payer: Self-pay | Admitting: Family Medicine

## 2022-09-09 ENCOUNTER — Ambulatory Visit: Admission: RE | Admit: 2022-09-09 | Payer: 59 | Source: Ambulatory Visit

## 2022-09-09 DIAGNOSIS — R9389 Abnormal findings on diagnostic imaging of other specified body structures: Secondary | ICD-10-CM

## 2022-09-19 ENCOUNTER — Other Ambulatory Visit: Payer: Self-pay | Admitting: Family Medicine

## 2022-09-19 DIAGNOSIS — R9389 Abnormal findings on diagnostic imaging of other specified body structures: Secondary | ICD-10-CM

## 2022-10-23 NOTE — Progress Notes (Signed)
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 I spoke to Dr.Mohamed and he suggested Dr.Ross make a referral to Pulmonology first. I called Britta Mccreedy back at [phone] and relayed Dr.Mohameds suggestion to her. Britta Mccreedy appreciated the information provided. I gave her my direct number if there were any additional questions or concerns.

## 2022-11-28 ENCOUNTER — Encounter: Payer: Self-pay | Admitting: Emergency Medicine

## 2022-11-28 ENCOUNTER — Ambulatory Visit: Payer: 59 | Admitting: Emergency Medicine

## 2022-11-28 VITALS — BP 138/88 | HR 64 | Temp 98.1°F | Ht 68.5 in | Wt 254.4 lb

## 2022-11-28 DIAGNOSIS — R918 Other nonspecific abnormal finding of lung field: Secondary | ICD-10-CM

## 2022-11-28 DIAGNOSIS — G4733 Obstructive sleep apnea (adult) (pediatric): Secondary | ICD-10-CM | POA: Diagnosis not present

## 2022-11-28 DIAGNOSIS — J386 Stenosis of larynx: Secondary | ICD-10-CM

## 2022-11-28 DIAGNOSIS — K219 Gastro-esophageal reflux disease without esophagitis: Secondary | ICD-10-CM

## 2022-11-28 NOTE — Progress Notes (Signed)
Subjective:    Patient ID: Chase Guzman, male    DOB: 23-Apr-1945, 77 y.o.   MRN: [id number]  HPI The patient is a 77 year old individual with a medical history significant for prostate cancer, obstructive sleep apnea, subglottic stenosis, eczema, hypothyroidism, GERD, and angioedema. The patient was hospitalized in May 2021 due to hypoxemic respiratory failure secondary to subglottic stenosis and pneumonia. In late September, the patient began experiencing a cough and a raspy voice, which was treated with antibiotics.  A CT scan of the chest performed in August 2024 revealed pulmonary nodules that had been previously identified on imaging, including a cardiac CT from September 2023. The nodules in the right middle lobe and left lower lobe were stable. However, a new 5mm left lower lobe nodule with an adjacent linear density of unclear significance was noted.  The patient also reported a history of lung trauma in 2021, which included aspiration pneumonia, a code blue event, and bilateral lung collapse requiring chest tube placement. The patient uses a CPAP machine for obstructive sleep apnea and sleeps upright due to a history of aspiration pneumonia caused by acid reflux.  The patient has a history of prostate cancer, with a consistently low PSA since prostatectomy in 2007. The patient also reported occasional episodes of coughing, particularly at night and after prolonged periods of talking. The cough is productive, and the patient uses Mucinex to manage the mucus production.  The patient also reported shortness of breath with exertion, particularly when walking uphill, which he attributes to being overweight. 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 The patient also reported occasional nasal drainage and congestion, which is managed with fluticasone nasal spray and Mucinex DM as needed.  The patient has lived in various locations, including [place], [place],  [place], [place], [place], [place], [place], [place], [place], and has traveled extensively in [place]. The patient has never smoked and has no known occupational exposures. The patient has a deviated septum, which contributes to mouth breathing.   RADIOLOGY CT chest: Stable nodules in the right middle lobe and left lower lobe. New 5 mm left lower lobe nodule with adjacent linear density of unclear significance. (09/09/2022) Cardiac CT: CT score of 19. (09/2021)  Review of Systems As per HPI  Past Medical History:  Diagnosis Date   Angio-edema    Eczema   Prostate cancer  Family History  Problem Relation Age of Onset   Asthma Father      Social History   Socioeconomic History   Marital status: Married    Spouse name: Not on file   Number of children: Not on file   Years of education: Not on file   Highest education level: Not on file  Occupational History   Not on file  Tobacco Use   Smoking status: Never   Smokeless tobacco: Never  Vaping Use   Vaping status: Never Used  Substance and Sexual Activity   Alcohol use: Yes    Comment: occasional    Drug use: Never   Sexual activity: Not on file  Other Topics Concern   Not on file  Social History Narrative   Not on file   Social Determinants of Health   Financial Resource Strain: Not on file  Food Insecurity: Not on file  Transportation Needs: Not on file  Physical Activity: Not on file  Stress: Not on file  Social Connections: Not on file  Intimate Partner Violence: Not on file  Allergies  Allergen Reactions   Amoxicillin Rash   Penicillins Rash     Outpatient Medications Prior to Visit  Medication Sig Dispense Refill   levothyroxine (SYNTHROID, LEVOTHROID) 75 MCG tablet Take 75 mcg by mouth daily before breakfast.     vitamin B-12 (CYANOCOBALAMIN) 50 MCG tablet Take 50 mcg by mouth daily.     VITAMIN D PO Take 1 tablet by mouth daily.     dextromethorphan-guaiFENesin  (MUCINEX DM) 30-600 MG 12hr tablet Take 1 tablet by mouth 2 (two) times daily. (Patient not taking: Reported on 11/28/2022)     pantoprazole (PROTONIX) 40 MG tablet Take 40 mg by mouth daily. (Patient not taking: Reported on 11/28/2022)     No facility-administered medications prior to visit.        Objective:   Physical Exam Vitals:   11/28/22 1401  BP: 138/88  Pulse: 64  Temp: 98.1 F (36.7 C)  TempSrc: Oral  SpO2: 98%  Weight: 254 lb 6.4 oz (115.4 kg)  Height: 5' 8.5" (1.74 m)   Gen: Pleasant, overweight man, in no distress,  normal affect  ENT: No lesions,  mouth clear,  oropharynx clear with some mild posterior pharyngeal erythema, no postnasal drip  Neck: No JVD, no stridor  Lungs: No use of accessory muscles, no crackles or wheezing on normal respiration, no wheeze on forced expiration  Cardiovascular: RRR, heart sounds normal, no murmur or gallops, trace lower extremity edema.  He has compression stockings in place  Musculoskeletal: No deformities, no cyanosis or clubbing  Neuro: alert, awake, non focal  Skin: Warm, no lesions or rash      Assessment & Plan:  Pulmonary nodules Pulmonary Nodules Stable nodules in the right middle lobe and left lower lobe. New 5mm left lower lobe nodule with adjacent linear density. Discussed the differential diagnosis including benign findings, infectious processes, noninfectious inflammatory processes, and malignancy. Noted that the patient's PSA is normal, reducing the likelihood of metastatic prostate cancer. -Repeat CT chest in March 2025 to monitor nodules. -Consider further evaluation such as PET scan or bronchoscopy depending on the results of the follow-up CT.   OSA on CPAP Obstructive Sleep Apnea Patient uses CPAP machine. Reports occasional coughing at night and raspy voice, possibly related to chronic nasal drainage.  Patient would like for [place] to take on management of his OSA -Obtain previous sleep study for  review. -Consider further evaluation or adjustment of CPAP settings if symptoms persist or worsen.  Subglottic stenosis Subglottic Stenosis No recent changes or interventions. Patient reports raspy voice and occasional cough, possibly related to chronic nasal drainage. -No further follow-up planned at this time.   GERD (gastroesophageal reflux disease) Gastroesophageal Reflux Disease (GERD) History of aspiration pneumonia due to acid reflux. Patient has stopped taking pantoprazole due to symptom resolution. -Monitor for recurrence of GERD symptoms.   Levy Pupa, MD, PhD 11/28/2022, 2:46 PM [facility] [phone] or if no answer before 7:00PM call [phone] For any issues after 7:00PM please call eLink [phone]

## 2022-11-28 NOTE — Assessment & Plan Note (Signed)
Obstructive Sleep Apnea Patient uses CPAP machine. Reports occasional coughing at night and raspy voice, possibly related to chronic nasal drainage.  Patient would like for [place] to take on management of his OSA -Obtain previous sleep study for review. -Consider further evaluation or adjustment of CPAP settings if symptoms persist or worsen.

## 2022-11-28 NOTE — Patient Instructions (Signed)
VISIT SUMMARY:  During today's visit, we reviewed your recent health concerns and discussed the findings from your recent CT scan. We also addressed your ongoing issues with obstructive sleep apnea, GERD, subglottic stenosis, and lower extremity edema. We have outlined a plan to monitor and manage these conditions moving forward.  YOUR PLAN:  -PULMONARY NODULES: Pulmonary nodules are small growths in the lungs. Your recent CT scan showed stable nodules in the right middle lobe and left lower lobe, but a new 5mm nodule in the left lower lobe. We will repeat the CT scan in March 2025 to monitor these nodules and consider further evaluation if needed.  -OBSTRUCTIVE SLEEP APNEA: Obstructive sleep apnea is a condition where your breathing stops and starts during sleep. You are using a CPAP machine, but you have reported occasional coughing at night and a raspy voice. We will review your previous sleep study and follow your CPAP use, settings here  -GASTROESOPHAGEAL REFLUX DISEASE (GERD): GERD is a condition where stomach acid frequently flows back into the tube connecting your mouth and stomach. You have a history of aspiration pneumonia due to acid reflux but have stopped taking pantoprazole as your symptoms have resolved. We will monitor for any recurrence of GERD symptoms.  -SUBGLOTTIC STENOSIS: Subglottic stenosis is a narrowing of the airway below the vocal cords. You have reported a raspy voice and occasional cough, which may be related to chronic nasal drainage. No further follow-up is planned at this time.  -LOWER EXTREMITY EDEMA: Lower extremity edema is swelling in the lower legs. You have mild swelling near your ankles, which has improved with the use of compression socks. Continue using compression socks as needed.  INSTRUCTIONS:  Please return to the clinic in March 2025 for a repeat CT chest scan. We will call you in February 2025 to schedule your March appointment.

## 2022-11-28 NOTE — Assessment & Plan Note (Signed)
Gastroesophageal Reflux Disease (GERD) History of aspiration pneumonia due to acid reflux. Patient has stopped taking pantoprazole due to symptom resolution. -Monitor for recurrence of GERD symptoms.

## 2022-11-28 NOTE — Assessment & Plan Note (Signed)
Pulmonary Nodules Stable nodules in the right middle lobe and left lower lobe. New 5mm left lower lobe nodule with adjacent linear density. Discussed the differential diagnosis including benign findings, infectious processes, noninfectious inflammatory processes, and malignancy. Noted that the patient's PSA is normal, reducing the likelihood of metastatic prostate cancer. -Repeat CT chest in March 2025 to monitor nodules. -Consider further evaluation such as PET scan or bronchoscopy depending on the results of the follow-up CT.

## 2022-11-28 NOTE — Assessment & Plan Note (Signed)
Subglottic Stenosis No recent changes or interventions. Patient reports raspy voice and occasional cough, possibly related to chronic nasal drainage. -No further follow-up planned at this time.

## 2023-03-19 ENCOUNTER — Encounter: Payer: Self-pay | Admitting: Family Medicine

## 2023-03-28 ENCOUNTER — Other Ambulatory Visit: Payer: 59

## 2023-04-14 ENCOUNTER — Ambulatory Visit
Admission: RE | Admit: 2023-04-14 | Discharge: 2023-04-14 | Disposition: A | Discharge status: Home / Self Care | Source: Ambulatory Visit | Attending: Family Medicine | Admitting: Family Medicine

## 2023-04-14 DIAGNOSIS — R9389 Abnormal findings on diagnostic imaging of other specified body structures: Secondary | ICD-10-CM

## 2023-07-03 ENCOUNTER — Encounter: Payer: Self-pay | Admitting: Emergency Medicine

## 2023-07-03 ENCOUNTER — Ambulatory Visit: Admitting: Emergency Medicine

## 2023-07-03 VITALS — BP 145/88 | HR 53 | Ht 68.0 in | Wt 243.2 lb

## 2023-07-03 DIAGNOSIS — R918 Other nonspecific abnormal finding of lung field: Secondary | ICD-10-CM

## 2023-07-03 DIAGNOSIS — J386 Stenosis of larynx: Secondary | ICD-10-CM

## 2023-07-03 DIAGNOSIS — G4733 Obstructive sleep apnea (adult) (pediatric): Secondary | ICD-10-CM | POA: Diagnosis not present

## 2023-07-03 NOTE — Patient Instructions (Addendum)
 We will plan to repeat your CT scan of the chest in March 2026. Continue your CPAP every night.  Will be happy to take over monitoring your compliance and ordering supplies if you need them.  We will review your compliance next March when we follow-up.  Bring your machine and your data card with you to that visit Follow in March 2026 to review your CT chest.  Please call sooner if you have any new issues.

## 2023-07-03 NOTE — Assessment & Plan Note (Signed)
 Overall stable in appearance.  Somewhat solid, others are mixed density and groundglass.  He will need surveillance for up to 5 years.  Plan next scan in March 2026.

## 2023-07-03 NOTE — Assessment & Plan Note (Signed)
 Overall doing well right now.  Stronger voice.

## 2023-07-03 NOTE — Assessment & Plan Note (Signed)
 Great compliance with the CPAP and good clinical benefit.  He is followed at [facility] but wants to start following here.  We will assess his compliance formally at his next visit in March 2026.

## 2023-07-03 NOTE — Progress Notes (Signed)
 Subjective:    Patient ID: Chase Guzman, male    DOB: [birth year], 78 y.o.   MRN: [id number]  HPI  ROV 07/03/2023 --- follow-up visit 78 year old man with history of prostate cancer, OSA, subglottic stenosis, eczema, hypothyroidism, GERD and angioedema.  He has a history of pulmonary nodules identified in 08/2021.  A follow-up CT 08/2022 showed overall stability but a new 5 mm left lower lobe nodule with adjacent linear density.  We repeated his CT chest 04/14/2023.  He is on CPAP reliably.  Report today that he is doing well. PSA is still normal. 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 His CPAP is ordered thru [facility], wants to change over to our office. Wears CPOAP reliably.   CT scan of the chest 04/14/2023 reviewed by me shows stable pulmonary nodules including 7 mm right lower lobe nodule, 3 mm left lower lobe nodule, 4 mm subpleural left lower lobe nodule, 9 mm left lower lobe nodule without any interval change or progression   Review of Systems As per HPI  Past Medical History:  Diagnosis Date   Angio-edema    Eczema   Prostate cancer  Family History  Problem Relation Age of Onset   Asthma Father      Social History   Socioeconomic History   Marital status: Married    Spouse name: Not on file   Number of children: Not on file   Years of education: Not on file   Highest education level: Not on file  Occupational History   Not on file  Tobacco Use   Smoking status: Never    Passive exposure: Never   Smokeless tobacco: Never  Vaping Use   Vaping status: Never Used  Substance and Sexual Activity   Alcohol use: Yes    Comment: occasional    Drug use: Never   Sexual activity: Not on file  Other Topics Concern   Not on file  Social History Narrative   Not on file   Social Drivers of Health   Financial Resource Strain: Not on file  Food Insecurity: Not on file  Transportation Needs: Not on file  Physical Activity: Not on file   Stress: Not on file  Social Connections: Not on file  Intimate Partner Violence: Not on file      Allergies  Allergen Reactions   Amoxicillin Rash   Penicillins Rash     Outpatient Medications Prior to Visit  Medication Sig Dispense Refill   levothyroxine (SYNTHROID, LEVOTHROID) 75 MCG tablet Take 75 mcg by mouth daily before breakfast.     Lutein 20 MG CAPS      vitamin B-12 (CYANOCOBALAMIN ) 50 MCG tablet Take 50 mcg by mouth daily.     VITAMIN D PO Take 1 tablet by mouth daily.     dextromethorphan-guaiFENesin (MUCINEX DM) 30-600 MG 12hr tablet Take 1 tablet by mouth 2 (two) times daily. (Patient not taking: Reported on 11/28/2022)     No facility-administered medications prior to visit.        Objective:   Physical Exam Vitals:   07/03/23 0920  BP: (!) 145/88  Pulse: (!) 53  SpO2: 96%  Weight: 243 lb 3.2 oz (110.3 kg)  Height: 5' 8 (1.727 m)   Gen: Pleasant, overweight man, in no distress,  normal affect  ENT: No lesions,  mouth clear,  oropharynx clear with some mild posterior pharyngeal erythema, no postnasal drip  Neck: No JVD, no stridor  Lungs:  No use of accessory muscles, no crackles or wheezing on normal respiration, no wheeze on forced expiration  Cardiovascular: RRR, heart sounds normal, no murmur or gallops, trace lower extremity edema.  He has compression stockings in place  Musculoskeletal: No deformities, no cyanosis or clubbing  Neuro: alert, awake, non focal  Skin: Warm, no lesions or rash      Assessment & Plan:  Pulmonary nodules Overall stable in appearance.  Somewhat solid, others are mixed density and groundglass.  He will need surveillance for up to 5 years.  Plan next scan in March 2026.  OSA on CPAP Great compliance with the CPAP and good clinical benefit.  He is followed [facility] but wants to start following here.  We will assess his compliance formally at his next visit in March 2026.  Subglottic stenosis Overall doing well  right now.  Stronger voice.   Racheal Buddle, MD, PhD 07/03/2023, 9:52 AM [facility] [phone] or if no answer before 7:00PM call [phone] For any issues after 7:00PM please call eLink [phone]

## 2023-11-13 DIAGNOSIS — Z23 Encounter for immunization: Secondary | ICD-10-CM | POA: Diagnosis not present

## 2024-01-06 DIAGNOSIS — H40033 Anatomical narrow angle, bilateral: Secondary | ICD-10-CM | POA: Diagnosis not present

## 2024-01-06 DIAGNOSIS — H40013 Open angle with borderline findings, low risk, bilateral: Secondary | ICD-10-CM | POA: Diagnosis not present

## 2024-01-06 DIAGNOSIS — H2513 Age-related nuclear cataract, bilateral: Secondary | ICD-10-CM | POA: Diagnosis not present

## 2024-03-11 ENCOUNTER — Other Ambulatory Visit

## 2024-03-31 ENCOUNTER — Ambulatory Visit: Admitting: Emergency Medicine
